# Patient Record
Sex: Male | Born: 1975 | Race: White | Hispanic: No | Marital: Married | Smoking: Never smoker
Health system: Southern US, Community
[De-identification: ages and names within clinical notes are randomized; demographics above are authoritative.]

## PROBLEM LIST (undated history)

## (undated) DIAGNOSIS — E785 Hyperlipidemia, unspecified: Secondary | ICD-10-CM

## (undated) DIAGNOSIS — K7581 Nonalcoholic steatohepatitis (NASH): Secondary | ICD-10-CM

## (undated) DIAGNOSIS — R030 Elevated blood-pressure reading, without diagnosis of hypertension: Secondary | ICD-10-CM

## (undated) DIAGNOSIS — Z8719 Personal history of other diseases of the digestive system: Secondary | ICD-10-CM

## (undated) DIAGNOSIS — F419 Anxiety disorder, unspecified: Secondary | ICD-10-CM

## (undated) DIAGNOSIS — F329 Major depressive disorder, single episode, unspecified: Secondary | ICD-10-CM

## (undated) DIAGNOSIS — F32A Depression, unspecified: Secondary | ICD-10-CM

## (undated) DIAGNOSIS — I83893 Varicose veins of bilateral lower extremities with other complications: Secondary | ICD-10-CM

## (undated) DIAGNOSIS — K219 Gastro-esophageal reflux disease without esophagitis: Secondary | ICD-10-CM

## (undated) HISTORY — DX: Gastro-esophageal reflux disease without esophagitis: K21.9

## (undated) HISTORY — PX: NO PAST SURGERIES: SHX2092

---

## 2002-07-27 ENCOUNTER — Encounter: Admission: RE | Admit: 2002-07-27 | Discharge: 2002-07-27 | Payer: Self-pay | Admitting: Family Medicine

## 2002-07-27 ENCOUNTER — Encounter: Payer: Self-pay | Admitting: Family Medicine

## 2002-08-23 ENCOUNTER — Encounter: Payer: Self-pay | Admitting: Neurology

## 2002-08-23 ENCOUNTER — Ambulatory Visit (HOSPITAL_COMMUNITY): Admission: RE | Admit: 2002-08-23 | Discharge: 2002-08-23 | Payer: Self-pay | Admitting: Neurology

## 2004-04-15 ENCOUNTER — Encounter: Admission: RE | Admit: 2004-04-15 | Discharge: 2004-04-15 | Payer: Self-pay | Admitting: Family Medicine

## 2005-12-08 ENCOUNTER — Ambulatory Visit: Payer: Self-pay | Admitting: Family Medicine

## 2005-12-22 ENCOUNTER — Ambulatory Visit: Payer: Self-pay | Admitting: Internal Medicine

## 2006-04-06 ENCOUNTER — Ambulatory Visit: Payer: Self-pay | Admitting: Family Medicine

## 2006-04-06 LAB — CONVERTED CEMR LAB
Basophils Relative: 0.6 % (ref 0.0–1.0)
Eosinophil percent: 0.9 % (ref 0.0–5.0)
HCT: 43.8 % (ref 39.0–52.0)
Hemoglobin: 15.2 g/dL (ref 13.0–17.0)
INR: 1 (ref 0.9–2.0)
MCHC: 34.8 g/dL (ref 30.0–36.0)
Monocytes Absolute: 0.9 10*3/uL — ABNORMAL HIGH (ref 0.2–0.7)
Neutrophils Relative %: 59.8 % (ref 43.0–77.0)
RDW: 11.9 % (ref 11.5–14.6)
WBC: 8.9 10*3/uL (ref 4.5–10.5)
aPTT: 31 s

## 2006-09-07 ENCOUNTER — Encounter: Admission: RE | Admit: 2006-09-07 | Discharge: 2006-09-07 | Payer: Self-pay | Admitting: Neurology

## 2006-10-02 ENCOUNTER — Encounter (INDEPENDENT_AMBULATORY_CARE_PROVIDER_SITE_OTHER): Payer: Self-pay | Admitting: Family Medicine

## 2006-11-15 ENCOUNTER — Telehealth (INDEPENDENT_AMBULATORY_CARE_PROVIDER_SITE_OTHER): Payer: Self-pay | Admitting: *Deleted

## 2007-01-06 ENCOUNTER — Ambulatory Visit: Payer: Self-pay | Admitting: Family Medicine

## 2007-01-06 DIAGNOSIS — Z77011 Contact with and (suspected) exposure to lead: Secondary | ICD-10-CM | POA: Insufficient documentation

## 2007-01-06 DIAGNOSIS — K219 Gastro-esophageal reflux disease without esophagitis: Secondary | ICD-10-CM | POA: Insufficient documentation

## 2007-01-12 ENCOUNTER — Telehealth (INDEPENDENT_AMBULATORY_CARE_PROVIDER_SITE_OTHER): Payer: Self-pay | Admitting: *Deleted

## 2007-01-16 ENCOUNTER — Encounter (INDEPENDENT_AMBULATORY_CARE_PROVIDER_SITE_OTHER): Payer: Self-pay | Admitting: *Deleted

## 2007-01-27 ENCOUNTER — Ambulatory Visit: Payer: Self-pay | Admitting: Family Medicine

## 2007-01-29 LAB — CONVERTED CEMR LAB
Glucose, Bld: 78 mg/dL (ref 70–99)
HDL: 44.1 mg/dL (ref >39.0)
LDL Cholesterol: 81 mg/dL (ref 0–99)
Total CHOL/HDL Ratio: 3.1
Triglycerides: 64 mg/dL (ref 0–149)
VLDL: 13 mg/dL (ref 0–40)

## 2007-01-30 ENCOUNTER — Telehealth (INDEPENDENT_AMBULATORY_CARE_PROVIDER_SITE_OTHER): Payer: Self-pay | Admitting: *Deleted

## 2007-01-30 ENCOUNTER — Encounter (INDEPENDENT_AMBULATORY_CARE_PROVIDER_SITE_OTHER): Payer: Self-pay | Admitting: *Deleted

## 2007-02-13 ENCOUNTER — Ambulatory Visit: Payer: Self-pay | Admitting: Gastroenterology

## 2007-02-15 ENCOUNTER — Encounter: Payer: Self-pay | Admitting: Gastroenterology

## 2007-02-15 ENCOUNTER — Ambulatory Visit: Payer: Self-pay | Admitting: Gastroenterology

## 2007-02-15 ENCOUNTER — Encounter (INDEPENDENT_AMBULATORY_CARE_PROVIDER_SITE_OTHER): Payer: Self-pay | Admitting: Family Medicine

## 2007-02-15 DIAGNOSIS — K449 Diaphragmatic hernia without obstruction or gangrene: Secondary | ICD-10-CM | POA: Insufficient documentation

## 2007-03-13 ENCOUNTER — Telehealth (INDEPENDENT_AMBULATORY_CARE_PROVIDER_SITE_OTHER): Payer: Self-pay | Admitting: *Deleted

## 2007-04-03 ENCOUNTER — Ambulatory Visit: Payer: Self-pay | Admitting: Internal Medicine

## 2007-05-25 DIAGNOSIS — I10 Essential (primary) hypertension: Secondary | ICD-10-CM | POA: Insufficient documentation

## 2007-05-25 DIAGNOSIS — E669 Obesity, unspecified: Secondary | ICD-10-CM | POA: Insufficient documentation

## 2008-01-22 ENCOUNTER — Telehealth (INDEPENDENT_AMBULATORY_CARE_PROVIDER_SITE_OTHER): Payer: Self-pay | Admitting: *Deleted

## 2008-01-23 ENCOUNTER — Encounter: Payer: Self-pay | Admitting: Internal Medicine

## 2008-01-23 ENCOUNTER — Telehealth (INDEPENDENT_AMBULATORY_CARE_PROVIDER_SITE_OTHER): Payer: Self-pay | Admitting: *Deleted

## 2010-08-18 NOTE — Assessment & Plan Note (Signed)
[facility]                         GASTROENTEROLOGY OFFICE NOTE   NAME:Phillip [name], Phillip [name]                 MRN:          [id number]  DATE:02/13/2007                            DOB:          Apr 16, 1975    Phillip [name] is a 35 year old white male referred through the courtesy of  Dr. Blossom Hoops for evaluation of chronic acid reflux disease.   HISTORY OF PRESENT ILLNESS:  Phillip [name] has had rather typical chronic  acid reflux for at least 15 years.  Phillip [name] describes this as a burning  substernal chest pain with regurgitation.  This is all completely  relieved by taking Nexium 40 mg a day.  Phillip [name] is currently having insurance  problems in terms of receiving his medications.  Phillip [name] denies associated  dysphagia, odynophagia, any hepatobiliary complaints, anorexia, weight  loss, change in bowel habits, melena or hematochezia.  Phillip [name] has never had  endoscopic exams or barium studies.  His weight has been stable, and Phillip [name]  does not smoke or use ethanol.  Phillip [name] denies Raynaud's phenomena, but does  have Ehlers-Danlos syndrome of a very mild degree.   PAST MEDICAL HISTORY:  The patient suffers from mild essential  hypertension but denies other medical difficulties.   ALLERGIES:  PENICILLIN.   FAMILY HISTORY:  Remarkable for Ehlers-Danlos syndrome in his mother.  His father has had colon polyps, and his paternal grandfather had colon  cancer in his 22s.   SOCIAL HISTORY:  Phillip [name] is married and lives with his wife and son.  Phillip [name] has  a bachelor's degree and works as a Furniture conservator/restorer.  Phillip [name] does not  smoke or use ethanol socially.  Phillip [name] denies problems with alcohol  dependency or abuse.   REVIEW OF SYSTEMS:  Otherwise entirely noncontributory.   PHYSICAL EXAMINATION:  VITAL SIGNS:  Phillip [name] is 6 feet, 1 inch and weighs  282.6 pounds.  Blood pressure 130/90, pulse 70 and regular.  GENERAL:  Phillip [name] is a healthy-appearing white male in no distress who  appears his stated age.  Phillip [name] is 6 feet,  1 inch and 283 pounds.  I could  not appreciate stigmata of chronic liver disease or thyromegaly.  CHEST:  Clear to percussion and auscultation.  HEART:  Phillip [name] was in a regular rhythm without significant murmurs, gallops,  or rubs.  ABDOMEN:  I could not appreciate hepatosplenomegaly, abdominal masses or  tenderness.  Bowel sounds were normal.  EXTREMITIES:  Unremarkable.  MENTAL STATUS:  Clear.  RECTAL:  Deferred.   ASSESSMENT:  1. Chronic acid reflux which is doing well on daily proton pump      inhibitor therapy.  2. Rule out Barrett's mucosa of the esophagus.  3. Mild obesity.  Rule out fatty infiltration of the liver.  4. Asymptomatic Ehlers-Danlos syndrome.  5. History of essential hypertension.  6. History of penicillin allergy.   RECOMMENDATIONS:  1. Outpatient endoscopic exam.  Exclude Barrett's mucosa.  2. Reflux regimen reviewed with patient, and will continue Nexium 40      mg 30 minutes before the first meal of the day.  3. Screening laboratory parameters.  4. Would advised colonoscopy at age 38.  Vania Rea. Jarold Motto, MD, Caleen Essex, FAGA  Electronically Signed    DRP/MedQ  DD: 02/13/2007  DT: 02/14/2007  Job #: [id number]   cc:   Leanne Chang, M.D.

## 2012-11-21 ENCOUNTER — Other Ambulatory Visit (INDEPENDENT_AMBULATORY_CARE_PROVIDER_SITE_OTHER): Payer: Self-pay

## 2012-11-21 ENCOUNTER — Ambulatory Visit (INDEPENDENT_AMBULATORY_CARE_PROVIDER_SITE_OTHER): Payer: Private Health Insurance - Indemnity | Admitting: Surgery

## 2012-11-21 ENCOUNTER — Encounter (INDEPENDENT_AMBULATORY_CARE_PROVIDER_SITE_OTHER): Payer: Self-pay | Admitting: Surgery

## 2012-11-21 VITALS — BP 120/86 | HR 92 | Resp 16 | Ht 72.0 in | Wt 299.4 lb

## 2012-11-21 DIAGNOSIS — G4733 Obstructive sleep apnea (adult) (pediatric): Secondary | ICD-10-CM

## 2012-11-21 DIAGNOSIS — Z6841 Body Mass Index (BMI) 40.0 and over, adult: Secondary | ICD-10-CM

## 2012-11-21 DIAGNOSIS — K2100 Gastro-esophageal reflux disease with esophagitis, without bleeding: Secondary | ICD-10-CM

## 2012-11-21 DIAGNOSIS — E669 Obesity, unspecified: Secondary | ICD-10-CM

## 2012-11-21 NOTE — Progress Notes (Signed)
Chief Complaint:  Lifelong morbid obesity with BMI 41  History of Present Illness:  Phillip [name]. is an 37 y.o. male Who comes in today with his wife and has been the one of our seminars and is done a lot of research and once a lap band placed. He has been successful with weight loss agents such as ephedrine when it was legal removing about 100 pounds. However he did not like the way this made him feel. He is done a lot of research and wants to move forward with half and and feels that his best for him. He does have reflux and we'll need to get an upper GI series to look for evidence of a hiatal hernia which we want to fix at the time of his lap band placement. He does have a lot of chronic loud snoring so we will see about getting a study for obstructive sleep apnea.  Past Medical History  Diagnosis Date  . Hyperlipidemia   . Hypertension   . GERD (gastroesophageal reflux disease)     History reviewed. No pertinent past surgical history.  Current Outpatient Prescriptions  Medication Sig Dispense Refill  . buPROPion (WELLBUTRIN XL) 300 MG 24 hr tablet Take 300 mg by mouth daily.      Marland Kitchen omeprazole (PRILOSEC) 20 MG capsule Take 20 mg by mouth daily.       No current facility-administered medications for this visit.   Penicillins Family History  Problem Relation Age of Onset  . Cancer Maternal Grandmother     colon   Social History:   reports that he has never smoked. He has never used smokeless tobacco. He reports that  drinks alcohol. He reports that he does not use illicit drugs.   REVIEW OF SYSTEMS - PERTINENT POSITIVES ONLY: And overall he says he is normal, breast normal, infectious disease is normal, dental normal, cardiac normal, pulmonary normal, and draped in normal, skin normal, GI hiatal hernia with reflux, GU normal, neurologic normal, hematologic lymphatic normal, immune normal, HEENT was glasses, musculoskeletal normal.  Physical Exam:   Blood pressure 120/86,  pulse 92, resp. rate 16, height 6' (1.829 m), weight 299 lb 6.4 oz (135.807 kg). Body mass index is 40.6 kg/(m^2).  Gen:  WDWN WM NAD  Neurological: Alert and oriented to person, place, and time. Motor and sensory function is grossly intact  Head: Normocephalic and atraumatic.  Eyes: Conjunctivae are normal. Pupils are equal, round, and reactive to light. No scleral icterus.  Neck: Normal range of motion. Neck supple. No tracheal deviation or thyromegaly present.  Cardiovascular:  SR without murmurs or gallops.  No carotid bruits Respiratory: Effort normal.  No respiratory distress. No chest wall tenderness. Breath sounds normal.  No wheezes, rales or rhonchi.  Abdomen:  Nontender, obese GU: Musculoskeletal: Normal range of motion. Extremities are nontender. No cyanosis, edema or clubbing noted Lymphadenopathy: No cervical, preauricular, postauricular or axillary adenopathy is present Skin: Skin is warm and dry. No rash noted. No diaphoresis. No erythema. No pallor. Pscyh: Normal mood and affect. Behavior is normal. Judgment and thought content normal.   LABORATORY RESULTS: No results found for this or any previous visit (from the past 48 hour(s)).  RADIOLOGY RESULTS: No results found.  Problem List: Patient Active Problem List   Diagnosis Date Noted  . OBESITY 05/25/2007  . ESSENTIAL HYPERTENSION 05/25/2007  . HIATAL HERNIA 02/15/2007  . GERD 01/06/2007  . HX, PERSONAL, EXPOSURE TO LEAD 01/06/2007    Assessment & Plan:  Morbid obesity BMI 41 with GERD, OSA    [name], MD, [facility] [phone] beeper [phone]  11/21/2012 5:03 PM

## 2012-11-21 NOTE — Addendum Note (Signed)
Addended by: Horatio Pel on: 11/21/2012 05:11 PM   Modules accepted: Orders

## 2012-11-21 NOTE — Patient Instructions (Signed)
Laparoscopic Gastric Band Surgery Care After These instructions give you information on caring for yourself after your procedure. Your doctor may also give you more specific instructions. Call your doctor if you have any problems or questions after your procedure. HOME CARE   Take walks throughout the day. Do not sit for longer than 1 hour while awake for 4 to 6 weeks.  You may shower 2 days after surgery. Pat the surgery cuts (incisions) dry. Do not rub the surgery cuts.  Do your coughing and deep breathing exercises.  Do not lift, push, or pull anything heavy until your doctor says it is okay.  Only take medicines as told by your doctor. Do not drive while taking pain medicine.  Drink plenty of fluids to keep your pee (urine) clear or pale yellow.  Stay on a clear liquid diet as long as your doctor tells you to.  Do not drink caffeine for 1 month.  Change bandages (dressings) as told by your doctor.  Check your surgery cuts for redness, pufffiness (swelling), abnormal coloring, fluid, or bleeding.  Follow your doctor's advice about vitamin and protein needs after surgery. GET HELP RIGHT AWAY IF:  You feel sick to your stomach (nauseous) and throw up (vomit).  You have pain and discomfort with swallowing.  You develop shortness of breath or difficulty breathing.  You have pain, puffiness, or feel warmth on your lower body.  You have very bad calf pain or pain not relieved by medicine.  You have a temperature by mouth above 102 F (38.9 C).  Your surgery cuts look red, puffy, or they leak fluid.  Your poop (stool) is black, tarry, or dark red.  You have chills.  You have chest pain.  You feel confused.  You have slurred speech.  You feel lightheaded when standing.  You suddenly feel weak.  You have any questions or concerns. MAKE SURE YOU:   Understand these instructions.  Will watch your condition.  Will get help right away if you are not doing well or  get worse. Document Released: 04/24/2010 Document Revised: 06/14/2011 Document Reviewed: 04/24/2010 [facility] Patient Information 2014 [place], [place].

## 2012-11-22 LAB — CBC WITH DIFFERENTIAL/PLATELET
Basophils Absolute: 0 10*3/uL (ref 0.0–0.1)
Eosinophils Absolute: 0.1 10*3/uL (ref 0.0–0.7)
Eosinophils Relative: 1 % (ref 0–5)
HCT: 43.4 % (ref 39.0–52.0)
Lymphocytes Relative: 37 % (ref 12–46)
MCH: 31.3 pg (ref 26.0–34.0)
MCHC: 34.8 g/dL (ref 30.0–36.0)
MCV: 90 fL (ref 78.0–100.0)
Monocytes Absolute: 0.6 10*3/uL (ref 0.1–1.0)
Platelets: 205 10*3/uL (ref 150–400)
RDW: 13.1 % (ref 11.5–15.5)

## 2012-11-22 LAB — TSH: TSH: 2.522 u[IU]/mL (ref 0.350–4.500)

## 2012-12-11 ENCOUNTER — Encounter: Payer: Managed Care, Other (non HMO) | Attending: Surgery | Admitting: Dietician

## 2012-12-11 ENCOUNTER — Encounter: Payer: Self-pay | Admitting: Dietician

## 2012-12-11 VITALS — Ht 72.0 in | Wt 312.1 lb

## 2012-12-11 DIAGNOSIS — E669 Obesity, unspecified: Secondary | ICD-10-CM | POA: Insufficient documentation

## 2012-12-11 DIAGNOSIS — Z713 Dietary counseling and surveillance: Secondary | ICD-10-CM | POA: Insufficient documentation

## 2012-12-11 NOTE — Patient Instructions (Addendum)
   Follow Pre-Op Nutrition Goals to prepare for LAGB Surgery.   Call the [facility] at [phone] once you have been given your surgery date to enrolled in the Pre-Op Nutrition Class. You will need to attend this nutrition class 3-4 weeks prior to your surgery. 

## 2012-12-11 NOTE — Progress Notes (Signed)
  Pre-Op Assessment Visit:  Pre-Operative LAGB Surgery  Medical Nutrition Therapy:  Appt start time: 1445   End time:  1530.  Patient was seen on 12/11/2012 for Pre-Operative LAGB Nutrition Assessment. Assessment and letter of approval faxed to [facility] Bariatric Surgery Program coordinator on 12/11/2012.   Handouts given during visit include:  Pre-Op Goals Bariatric Surgery Protein Shakes  Patient to call the [facility] to enroll in Pre-Op and Post-Op Nutrition Education when surgery date is scheduled.

## 2012-12-14 ENCOUNTER — Ambulatory Visit (HOSPITAL_COMMUNITY)
Admission: RE | Admit: 2012-12-14 | Discharge: 2012-12-14 | Disposition: A | Discharge status: Home / Self Care | Payer: Managed Care, Other (non HMO) | Source: Ambulatory Visit | Attending: Surgery | Admitting: Surgery

## 2012-12-14 ENCOUNTER — Ambulatory Visit (HOSPITAL_COMMUNITY): Admission: RE | Admit: 2012-12-14 | Payer: Managed Care, Other (non HMO) | Source: Ambulatory Visit

## 2012-12-14 ENCOUNTER — Other Ambulatory Visit: Payer: Self-pay

## 2012-12-14 DIAGNOSIS — E785 Hyperlipidemia, unspecified: Secondary | ICD-10-CM | POA: Insufficient documentation

## 2012-12-14 DIAGNOSIS — I10 Essential (primary) hypertension: Secondary | ICD-10-CM | POA: Insufficient documentation

## 2012-12-14 DIAGNOSIS — K7689 Other specified diseases of liver: Secondary | ICD-10-CM | POA: Insufficient documentation

## 2012-12-14 DIAGNOSIS — K219 Gastro-esophageal reflux disease without esophagitis: Secondary | ICD-10-CM | POA: Insufficient documentation

## 2012-12-14 DIAGNOSIS — G4733 Obstructive sleep apnea (adult) (pediatric): Secondary | ICD-10-CM | POA: Insufficient documentation

## 2012-12-14 DIAGNOSIS — Z6841 Body Mass Index (BMI) 40.0 and over, adult: Secondary | ICD-10-CM | POA: Insufficient documentation

## 2012-12-14 DIAGNOSIS — K449 Diaphragmatic hernia without obstruction or gangrene: Secondary | ICD-10-CM | POA: Insufficient documentation

## 2012-12-19 ENCOUNTER — Encounter (HOSPITAL_COMMUNITY)
Admission: RE | Disposition: A | Discharge status: Home / Self Care | Payer: Self-pay | Source: Ambulatory Visit | Attending: Surgery

## 2012-12-19 ENCOUNTER — Ambulatory Visit (HOSPITAL_COMMUNITY)
Admission: RE | Admit: 2012-12-19 | Discharge: 2012-12-19 | Disposition: A | Discharge status: Home / Self Care | Payer: Managed Care, Other (non HMO) | Source: Ambulatory Visit | Attending: Surgery | Admitting: Surgery

## 2012-12-19 HISTORY — PX: BREATH TEK H PYLORI: SHX5422

## 2012-12-19 SURGERY — BREATH TEST, FOR HELICOBACTER PYLORI

## 2012-12-20 ENCOUNTER — Encounter (HOSPITAL_COMMUNITY): Payer: Self-pay | Admitting: Surgery

## 2012-12-22 ENCOUNTER — Ambulatory Visit (HOSPITAL_BASED_OUTPATIENT_CLINIC_OR_DEPARTMENT_OTHER): Payer: Managed Care, Other (non HMO) | Attending: Surgery

## 2012-12-22 VITALS — Ht 72.0 in | Wt 296.0 lb

## 2012-12-22 DIAGNOSIS — G4733 Obstructive sleep apnea (adult) (pediatric): Secondary | ICD-10-CM

## 2012-12-22 DIAGNOSIS — R0609 Other forms of dyspnea: Secondary | ICD-10-CM | POA: Insufficient documentation

## 2012-12-22 DIAGNOSIS — R0989 Other specified symptoms and signs involving the circulatory and respiratory systems: Secondary | ICD-10-CM | POA: Insufficient documentation

## 2012-12-22 DIAGNOSIS — I10 Essential (primary) hypertension: Secondary | ICD-10-CM | POA: Insufficient documentation

## 2012-12-23 DIAGNOSIS — G4733 Obstructive sleep apnea (adult) (pediatric): Secondary | ICD-10-CM

## 2013-01-09 ENCOUNTER — Encounter: Payer: Managed Care, Other (non HMO) | Attending: Surgery | Admitting: Dietician

## 2013-01-09 VITALS — Ht 72.0 in | Wt 309.4 lb

## 2013-01-09 DIAGNOSIS — E669 Obesity, unspecified: Secondary | ICD-10-CM

## 2013-01-09 DIAGNOSIS — Z713 Dietary counseling and surveillance: Secondary | ICD-10-CM | POA: Insufficient documentation

## 2013-01-09 NOTE — Progress Notes (Signed)
  3 Months Supervised Weight Loss Visit:   Pre-Operative LAGB Surgery  Medical Nutrition Therapy:  Appt start time: 1645 end time:  1700.  Primary concerns today: Supervised Weight Loss Visit. Eileen is also seeing his physician for supervised weight loss. Tried cutting back on wheat per the advice of his doctor and is working on tapering down on caffeine. He is having less sodas than before.   Not eating much since his wife had breast cancer surgery two week ago.   Weight: 309.4 lbs  BMI: 42.0  24-hr recall: B (AM): Jimmy Dean chicken sausage in tortilla or skips lately Snk (AM):none  L (PM):leftovers Snk (PM): none D (PM):hamburger and mashed pototaos or tacos Snk (PM): tries not to, sometimes will have something sweets  Drinking mostly water and occassionally having soda  Medications: see list  Recent physical activity:  Running 3 x week for a couple miles  Progress Towards Goal(s):  In progress.  Nutritional Diagnosis:  [place]-3.3 Overweight/obesity related to past poor dietary habits and physical inactivity as evidenced by patient approved for bariatric surgery following dietary guidelines for continued weight loss.    Intervention:  Nutrition counseling provided.   Plan: Continue working on [employer]. Consider having Crystal Light or Mio to add to water to help get at least 64 oz per day. Work on chewing thoroughly to help with not drinking at meal times. Keep exercising with a goal of 30 min 5 x day.  Monitoring/Evaluation:  Dietary intake, exercise, and body weight. Follow up in 1 months for 3 month supervised weight loss visit.

## 2013-01-09 NOTE — Patient Instructions (Addendum)
Continue working on [employer]. Consider having Crystal Light or Mio to add to water to help get at least 64 oz per day. Work on chewing thoroughly to help with not drinking at meal times. Keep exercising with a goal of 30 min 5 x day.

## 2013-01-23 NOTE — Procedures (Signed)
NAMEEWEL, LONA               ACCOUNT NO.:  [id number]  MEDICAL RECORD NO.:  [id number]         PATIENT TYPE:  OUT  LOCATION:  SLEEP CENTER                 FACILITY:  [facility]  PHYSICIAN:  Clinton D. Maple Hudson, MD, FCCP, FACPDATE OF BIRTH:  09-29-1977  DATE OF STUDY:  12/22/2012                           NOCTURNAL POLYSOMNOGRAM  REFERRING PHYSICIAN:  Thornton Park. Daphine Deutscher, MD  INDICATION FOR STUDY:  Hypersomnia with sleep apnea.  EPWORTH SLEEPINESS SCORE:  14/24.  BMI 40.1, weight 296 pounds, height 72 inches, neck 16 inches.  MEDICATIONS:  Home medications were charted for review.  SLEEP ARCHITECTURE:  Total sleep time 334.5 minutes with sleep efficiency 79.2%.  Stage I was 10.5%.  Stage II 64.1%.  Stage III absent.  REM 25.4% of total sleep time.  Sleep latency 14.5 minutes, REM latency 168.5 minutes, awake after sleep onset 72.5 minutes.  Arousal index 11.3.  Bedtime medication:  None.  RESPIRATORY DATA:  Apnea-hypopnea index (AHI) 4.8 per hour.  A total of 27 events was scored, all is hypopneas.  Events were more common while supine.  REM AHI of 7.1 per hour.  He did not qualify for split protocol CPAP.  OXYGEN DATA:  Moderate snoring with oxygen desaturation to a nadir of 88% and mean oxygen saturation through the study of 95% on room air.  CARDIAC DATA:  Sinus rhythm.  MOVEMENT-PARASOMNIA:  A few limb jerks were noted with little effect on sleep.  No bathroom trips.  IMPRESSIONS-RECOMMENDATIONS:  Occasional respiratory events with sleep disturbance, within normal limits.  AHI 4.8 per hour (the normal range for adults is an AHI from 0-5 events per hour).  Moderate snoring with oxygen desaturation to a nadir of 88% and mean oxygen saturation through the study of 95% on room air.     Clinton D. Maple Hudson, MD, [facility], FACP Diplomate, American Board of Sleep Medicine    CDY/MEDQ  D:  12/23/2012 11:49:56  T:  12/23/2012 12:53:26  Job:  [id number]

## 2013-02-08 ENCOUNTER — Encounter: Payer: Managed Care, Other (non HMO) | Attending: Surgery | Admitting: Dietician

## 2013-02-08 VITALS — Ht 72.0 in | Wt 310.3 lb

## 2013-02-08 DIAGNOSIS — E669 Obesity, unspecified: Secondary | ICD-10-CM

## 2013-02-08 DIAGNOSIS — Z713 Dietary counseling and surveillance: Secondary | ICD-10-CM | POA: Insufficient documentation

## 2013-02-08 NOTE — Progress Notes (Signed)
  3 Months Supervised Weight Loss Visit:   Pre-Operative LAGB Surgery  Medical Nutrition Therapy:  Appt start time: 1530 end time:  1545.  Primary concerns today: Supervised Weight Loss Visit. Phillip [name] is also seeing his physician for supervised weight loss. Working with his doctor on portion control. Still having some caffeine and the same amount of sodas as last time.   310.3 Weight: 309.4 lbs  BMI: 42.0  24-hr recall: B (AM): Jimmy Dean chicken sausage in tortilla or skips lately Snk (AM):none  L (PM):leftovers Snk (PM): none D (PM):hamburger and mashed pototaos or tacos Snk (PM): tries not to, sometimes will have something sweets  Drinking mostly water and occassionally having soda  Medications: see list  Recent physical activity:  Running 3 x week for a couple miles  Progress Towards Goal(s):  In progress.  Nutritional Diagnosis:  [place]-3.3 Overweight/obesity related to past poor dietary habits and physical inactivity as evidenced by patient approved for bariatric surgery following dietary guidelines for continued weight loss.    Intervention:  Nutrition counseling provided.   Plan: Continue working on [employer]. Continue drinking 64 oz water per day, continue working on sipping and limiting sodas.  Continue chewing thoroughly to help with not drinking at meal times. Keep exercising with a goal of 30 min 5 x day.  Work on eating 3 meals per day and 2-3 snacks (try to eat something like a protein shake in the morning).    Monitoring/Evaluation:  Dietary intake, exercise, and body weight. Follow up in 1 months for 3 month supervised weight loss visit.

## 2013-02-08 NOTE — Patient Instructions (Addendum)
Continue working on [employer]. Continue drinking 64 oz water per day, continue working on sipping and limiting sodas.  Continue chewing thoroughly to help with not drinking at meal times. Keep exercising with a goal of 30 min 5 x day.  Work on eating 3 meals per day and 2-3 snacks (try to eat something like a protein shake in the morning).

## 2013-03-08 ENCOUNTER — Encounter: Payer: Managed Care, Other (non HMO) | Attending: Surgery | Admitting: Dietician

## 2013-03-08 VITALS — Ht 72.0 in | Wt 305.0 lb

## 2013-03-08 DIAGNOSIS — E669 Obesity, unspecified: Secondary | ICD-10-CM

## 2013-03-08 DIAGNOSIS — Z713 Dietary counseling and surveillance: Secondary | ICD-10-CM | POA: Insufficient documentation

## 2013-03-08 NOTE — Patient Instructions (Addendum)
Continue working on [employer]. Continue drinking 64 oz water per day, continue working on sipping and limiting sodas.  Continue chewing thoroughly to help with not drinking at meal times. Exercise with a goal of 30 min 5 x day.  Work on eating 3 meals per day and 2-3 snacks (try to eat something like a protein shake in the morning).   Contact [facility] when surgery is scheduled to enroll in Pre-Op class.

## 2013-03-08 NOTE — Progress Notes (Signed)
  3 Months Supervised Weight Loss Visit:   Pre-Operative LAGB Surgery  Medical Nutrition Therapy:  Appt start time: 1715 end time:  1730.  Primary concerns today: Supervised Weight Loss Visit. Lost about 5 pounds since last visit. Continued to work on portion control. Still tapering down on soda and caffeine. Hasn't had fast food in past 4 weeks.   Weight: 305 lbs BMI: 41.3  24-hr recall: B (AM): [employer] chicken sausage in tortilla or skips lately Snk (AM):none  L (PM):leftovers Snk (PM): none D (PM):hamburger and mashed pototaos or tacos Snk (PM): tries not to, sometimes will have something sweets  Drinking mostly water and occassionally having soda  Medications: see list  Recent physical activity:  Stopped running d/t working 13 hours per day, plans to start in [place]   Progress Towards Goal(s):  In progress.  Nutritional Diagnosis:  [place]-3.3 Overweight/obesity related to past poor dietary habits and physical inactivity as evidenced by patient approved for bariatric surgery following dietary guidelines for continued weight loss.    Intervention:  Nutrition counseling provided.   Plan: Continue working on [employer]. Continue drinking 64 oz water per day, continue working on sipping and limiting sodas.  Continue chewing thoroughly to help with not drinking at meal times. Exercise with a goal of 30 min 5 x day.  Work on eating 3 meals per day and 2-3 snacks (try to eat something like a protein shake in the morning).   Contact [facility] when surgery is scheduled to enroll in Pre-Op class.   Monitoring/Evaluation:  Dietary intake, exercise, and body weight. Follow up for Pre-Op class.

## 2013-04-26 ENCOUNTER — Telehealth (INDEPENDENT_AMBULATORY_CARE_PROVIDER_SITE_OTHER): Payer: Self-pay

## 2013-04-26 NOTE — Telephone Encounter (Signed)
Message copied by Maryan PulsMOORE, Lovena Kluck on Thu Apr 26, 2013  5:15 PM ------      Message from: Louie CasaBARAJAS, RUTH      Created: Mon Apr 16, 2013 10:40 AM      Regarding: Dr. Judeth CornfieldMartin/Question?      Contact: [phone]       Patient is going to have a bariatric surgery but he has hernia problem and wants to know if Dr. Daphine DeutscherMartin can do both surgeries at the same time, please call him.            Thank you. ------

## 2013-04-26 NOTE — Telephone Encounter (Signed)
Called and spoke to patient regarding having his hernia repaired at the time of his bariatric surgery.  I reviewed with Dr. Daphine DeutscherMartin and he did say that this is possible.  Patient made aware to discuss this during his pre-op appointment once scheduled for surgery.  Patient verbalized understanding

## 2013-06-12 NOTE — Progress Notes (Signed)
Surgery on 06/26/13.  Preop on 06/19/13 at 0800am.  Need orders in EPIC.  Thank You.

## 2013-06-14 NOTE — Progress Notes (Signed)
Surgery on 06/26/13.  Preop on 3/17 at 0800am.  Need orders in EPIC. Thank You.

## 2013-06-15 ENCOUNTER — Encounter (HOSPITAL_COMMUNITY): Payer: Self-pay | Admitting: Pharmacy Technician

## 2013-06-18 NOTE — Patient Instructions (Signed)
20 Charan E Johnnye SimaBunnell Jr.  06/18/2013   Your procedure is scheduled on: 06/26/13  [facility].  Call this number if you have problems the morning of surgery 336-: [phone]   Remember:   Do not eat food or drink liquids After Midnight.     Take these medicines the morning of surgery with A SIP OF WATER: wellbutrin, omeprazole    Do not wear jewelry, make-up or nail polish.  Do not wear lotions, powders, or perfumes. You may wear deodorant.  Do not shave 48 hours prior to surgery. Men may shave face and neck.  Do not bring valuables to the hospital.  Contacts, dentures or bridgework may not be worn into surgery.  Leave suitcase in the car. After surgery it may be brought to your room.  For patients admitted to the hospital, checkout time is 11:00 AM the day of discharge.    Please read over the following fact sheets that you were given:[place] preparing for surgery sheet Birdie Sonsachel Amontae Ng, RN  pre op nurse call if needed [phone]    FAILURE TO FOLLOW THESE INSTRUCTIONS MAY RESULT IN CANCELLATION OF YOUR SURGERY   Patient Signature: ___________________________________________

## 2013-06-18 NOTE — Progress Notes (Signed)
Chest x-ray 12/14/12 on EPIC, EKG 12/14/12 on EPIC

## 2013-06-19 ENCOUNTER — Encounter (HOSPITAL_COMMUNITY)
Admission: RE | Admit: 2013-06-19 | Discharge: 2013-06-19 | Disposition: A | Discharge status: Home / Self Care | Payer: Managed Care, Other (non HMO) | Source: Ambulatory Visit | Attending: Surgery | Admitting: Surgery

## 2013-06-19 ENCOUNTER — Encounter (HOSPITAL_COMMUNITY): Payer: Self-pay

## 2013-06-19 DIAGNOSIS — Z01812 Encounter for preprocedural laboratory examination: Secondary | ICD-10-CM | POA: Insufficient documentation

## 2013-06-19 HISTORY — DX: Major depressive disorder, single episode, unspecified: F32.9

## 2013-06-19 HISTORY — DX: Depression, unspecified: F32.A

## 2013-06-19 HISTORY — DX: Anxiety disorder, unspecified: F41.9

## 2013-06-19 HISTORY — DX: Elevated blood-pressure reading, without diagnosis of hypertension: R03.0

## 2013-06-19 HISTORY — DX: Hyperlipidemia, unspecified: E78.5

## 2013-06-19 LAB — CBC
HCT: 44.7 % (ref 39.0–52.0)
Hemoglobin: 15.4 g/dL (ref 13.0–17.0)
MCH: 31.3 pg (ref 26.0–34.0)
MCHC: 34.5 g/dL (ref 30.0–36.0)
MCV: 90.9 fL (ref 78.0–100.0)
PLATELETS: 203 10*3/uL (ref 150–400)
RBC: 4.92 MIL/uL (ref 4.22–5.81)
RDW: 12.4 % (ref 11.5–15.5)
WBC: 7.8 10*3/uL (ref 4.0–10.5)

## 2013-06-19 LAB — BASIC METABOLIC PANEL
BUN: 14 mg/dL (ref 6–23)
CALCIUM: 10.1 mg/dL (ref 8.4–10.5)
CO2: 26 meq/L (ref 19–32)
Chloride: 100 mEq/L (ref 96–112)
Creatinine, Ser: 1.17 mg/dL (ref 0.50–1.35)
GFR calc non Af Amer: 79 mL/min — ABNORMAL LOW (ref >90)
Glucose, Bld: 93 mg/dL (ref 70–99)
Potassium: 4 mEq/L (ref 3.7–5.3)
SODIUM: 140 meq/L (ref 137–147)

## 2013-06-21 ENCOUNTER — Encounter: Payer: Managed Care, Other (non HMO) | Attending: Surgery

## 2013-06-21 VITALS — Ht 72.0 in | Wt 293.0 lb

## 2013-06-21 DIAGNOSIS — E669 Obesity, unspecified: Secondary | ICD-10-CM

## 2013-06-21 DIAGNOSIS — Z713 Dietary counseling and surveillance: Secondary | ICD-10-CM | POA: Insufficient documentation

## 2013-06-21 NOTE — Progress Notes (Signed)
  Pre-Operative Nutrition Class:  Appt start time: 830   End time:  1030.  Patient was seen on 06/21/2013 for Pre-Operative Bariatric Surgery Education at [facility].   Surgery date: 06/26/2013 Surgery type: LAGB Start weight at [facility]: 312 on 12/11/2012 Weight today: 293 lbs  TANITA  BODY COMP RESULTS  06/21/2013   BMI (kg/m^2) 39.7   Fat Mass (lbs) 106.5   Fat Free Mass (lbs) 186.5   Total Body Water (lbs) 136.5   Samples given per MNT protocol. Patient educated on appropriate usage: Celebrate Multivitamin (grape)  Lot #: [id number]  Exp: 07/2014   Bariactiv Calcium Citrate (berry)  Lot #: [id number]  Exp: 08/2014   Premier protein shake (vanilla)  Lot #: [id number]  Exp: 01/2014   Renee Pain Protein Powder (chocolate)  Lot #: [id number]  Exp: 07/2014   The following the learning objectives were met by the patient during this course:  Identify Pre-Op Dietary Goals and will begin 2 weeks pre-operatively  Identify appropriate sources of fluids and proteins   State protein recommendations and appropriate sources pre and post-operatively  Identify Post-Operative Dietary Goals and will follow for 2 weeks post-operatively  Identify appropriate multivitamin and calcium sources  Describe the need for physical activity post-operatively and will follow MD recommendations  State when to call healthcare provider regarding medication questions or post-operative complications  Handouts given during class include:  Pre-Op Bariatric Surgery Diet Handout  Protein Shake Handout  Post-Op Bariatric Surgery Nutrition Handout  BELT Program Information Flyer  Support Group Information Flyer  [facility] Bariatric Supplements Price List  Follow-Up Plan: Patient will follow-up at [facility] 2 weeks post operatively for diet advancement per MD.

## 2013-06-22 ENCOUNTER — Other Ambulatory Visit (INDEPENDENT_AMBULATORY_CARE_PROVIDER_SITE_OTHER): Payer: Self-pay | Admitting: Surgery

## 2013-06-22 ENCOUNTER — Encounter (INDEPENDENT_AMBULATORY_CARE_PROVIDER_SITE_OTHER): Payer: Self-pay | Admitting: Surgery

## 2013-06-22 ENCOUNTER — Ambulatory Visit (INDEPENDENT_AMBULATORY_CARE_PROVIDER_SITE_OTHER): Payer: Private Health Insurance - Indemnity | Admitting: Surgery

## 2013-06-22 VITALS — BP 120/80 | HR 80 | Temp 97.6°F | Resp 16 | Ht 72.0 in | Wt 297.0 lb

## 2013-06-22 DIAGNOSIS — E669 Obesity, unspecified: Secondary | ICD-10-CM

## 2013-06-22 DIAGNOSIS — K219 Gastro-esophageal reflux disease without esophagitis: Secondary | ICD-10-CM

## 2013-06-22 MED ORDER — HYDROCODONE-ACETAMINOPHEN 7.5-325 MG/15ML PO SOLN: 10.0000 mL | Freq: Four times a day (QID) | ORAL | Status: DC | PRN

## 2013-06-22 NOTE — Progress Notes (Signed)
Chief Complaint:  Lifelong morbid obesity with BMI 41  History of Present Illness:  Phillip [name]. is an 38 y.o. male who comes in today with his wife and has been the one of our seminars and is done a lot of research and wants a lap band placed. He has been successful with weight loss agents such as ephedrine when it was legal removing about 100 pounds. However he did not like the way this made him feel. He has done a lot of research and wants to move forward with lapband and feels that his best for him. He does have reflux and we'll need to get an upper GI series to look for evidence of a hiatal hernia which we want to fix at the time of his lap band placement. He does have a lot of chronic loud snoring so we will see about getting a study for obstructive sleep apnea.  UGI showed small hiatus hernia with reflux.    Past Medical History  Diagnosis Date  . GERD (gastroesophageal reflux disease)   . Anxiety   . Depression   . Borderline high blood pressure   . Borderline hyperlipidemia     Past Surgical History  Procedure Laterality Date  . Breath tek h pylori N/A 12/19/2012    Procedure: BREATH TEK H PYLORI;  Surgeon: Valarie MerinoMatthew B Nikya Busler, MD;  [facility];  Service: General;  Laterality: N/A;  Pt is scheduled at 730  . No past surgeries      Current Outpatient Prescriptions  Medication Sig Dispense Refill  . buPROPion (WELLBUTRIN XL) 300 MG 24 hr tablet Take 300 mg by mouth every morning.       Marland Kitchen. omeprazole (PRILOSEC) 20 MG capsule Take 20 mg by mouth daily.      Marland Kitchen. HYDROcodone-acetaminophen (HYCET) 7.5-325 mg/15 ml solution Take 10 mLs by mouth 4 (four) times daily as needed for moderate pain.  120 mL  0   No current facility-administered medications for this visit.   Penicillins Family History  Problem Relation Age of Onset  . Cancer Maternal Grandmother     colon  . COPD Other   . Hypertension Other   . Hyperlipidemia Other   . Obesity Other    Social History:    reports that he has never smoked. He quit smokeless tobacco use about 3 years ago. His smokeless tobacco use included Chew. He reports that he drinks alcohol. He reports that he does not use illicit drugs.   REVIEW OF SYSTEMS - PERTINENT POSITIVES ONLY: And overall he says he is normal, breast normal, infectious disease is normal, dental normal, cardiac normal, pulmonary normal, and draped in normal, skin normal, GI hiatal hernia with reflux, GU normal, neurologic normal, hematologic lymphatic normal, immune normal, HEENT was glasses, musculoskeletal normal.  Physical Exam:   Blood pressure 120/80, pulse 80, temperature 97.6 F (36.4 C), temperature source Temporal, resp. rate 16, height 5' (1.524 m), weight 297 lb (134.718 kg). Body mass index is 58 kg/(m^2).  Gen:  WDWN WM NAD  Neurological: Alert and oriented to person, place, and time. Motor and sensory function is grossly intact  Head: Normocephalic and atraumatic.  Eyes: Conjunctivae are normal. Pupils are equal, round, and reactive to light. No scleral icterus.  Neck: Normal range of motion. Neck supple. No tracheal deviation or thyromegaly present.  Cardiovascular:  SR without murmurs or gallops.  No carotid bruits Respiratory: Effort normal.  No respiratory distress. No chest wall tenderness. Breath sounds normal.  No wheezes, rales or rhonchi.  Abdomen:  Nontender, obese GU: Musculoskeletal: Normal range of motion. Extremities are nontender. No cyanosis, edema or clubbing noted Lymphadenopathy: No cervical, preauricular, postauricular or axillary adenopathy is present Skin: Skin is warm and dry. No rash noted. No diaphoresis. No erythema. No pallor. Pscyh: Normal mood and affect. Behavior is normal. Judgment and thought content normal.   LABORATORY RESULTS: No results found for this or any previous visit (from the past 48 hour(s)).  RADIOLOGY RESULTS: No results found.  Problem List: Patient Active Problem List   Diagnosis  Date Noted  . OBESITY 05/25/2007  . ESSENTIAL HYPERTENSION 05/25/2007  . HIATAL HERNIA 02/15/2007  . GERD 01/06/2007  . HX, PERSONAL, EXPOSURE TO LEAD 01/06/2007    Assessment & Plan: Morbid obesity BMI 41 with GERD, OSA    [name], MD, [facility] [phone] beeper [phone]  06/22/2013 12:00 PM

## 2013-06-22 NOTE — Patient Instructions (Signed)

## 2013-06-26 ENCOUNTER — Encounter (HOSPITAL_COMMUNITY): Payer: Self-pay | Admitting: *Deleted

## 2013-06-26 ENCOUNTER — Observation Stay (HOSPITAL_COMMUNITY)
Admission: RE | Admit: 2013-06-26 | Discharge: 2013-06-27 | Disposition: A | Discharge status: Home / Self Care | Payer: Managed Care, Other (non HMO) | Source: Ambulatory Visit | Attending: Surgery | Admitting: Surgery

## 2013-06-26 ENCOUNTER — Encounter (HOSPITAL_COMMUNITY): Payer: Managed Care, Other (non HMO) | Admitting: Anesthesiology

## 2013-06-26 ENCOUNTER — Ambulatory Visit (HOSPITAL_COMMUNITY): Payer: Managed Care, Other (non HMO) | Admitting: Anesthesiology

## 2013-06-26 ENCOUNTER — Encounter (HOSPITAL_COMMUNITY)
Admission: RE | Disposition: A | Discharge status: Home / Self Care | Payer: Self-pay | Source: Ambulatory Visit | Attending: Surgery

## 2013-06-26 DIAGNOSIS — K2100 Gastro-esophageal reflux disease with esophagitis, without bleeding: Secondary | ICD-10-CM

## 2013-06-26 DIAGNOSIS — K420 Umbilical hernia with obstruction, without gangrene: Secondary | ICD-10-CM

## 2013-06-26 DIAGNOSIS — Z6841 Body Mass Index (BMI) 40.0 and over, adult: Secondary | ICD-10-CM

## 2013-06-26 DIAGNOSIS — Z9884 Bariatric surgery status: Secondary | ICD-10-CM

## 2013-06-26 DIAGNOSIS — K449 Diaphragmatic hernia without obstruction or gangrene: Secondary | ICD-10-CM | POA: Insufficient documentation

## 2013-06-26 DIAGNOSIS — K219 Gastro-esophageal reflux disease without esophagitis: Secondary | ICD-10-CM | POA: Insufficient documentation

## 2013-06-26 DIAGNOSIS — Z87891 Personal history of nicotine dependence: Secondary | ICD-10-CM | POA: Insufficient documentation

## 2013-06-26 DIAGNOSIS — G4733 Obstructive sleep apnea (adult) (pediatric): Secondary | ICD-10-CM | POA: Insufficient documentation

## 2013-06-26 DIAGNOSIS — F411 Generalized anxiety disorder: Secondary | ICD-10-CM | POA: Insufficient documentation

## 2013-06-26 DIAGNOSIS — F329 Major depressive disorder, single episode, unspecified: Secondary | ICD-10-CM | POA: Insufficient documentation

## 2013-06-26 DIAGNOSIS — F3289 Other specified depressive episodes: Secondary | ICD-10-CM | POA: Insufficient documentation

## 2013-06-26 HISTORY — PX: LAPAROSCOPIC GASTRIC BANDING WITH HIATAL HERNIA REPAIR: SHX6351

## 2013-06-26 HISTORY — PX: UMBILICAL HERNIA REPAIR: SHX196

## 2013-06-26 LAB — CBC
HCT: 40.2 % (ref 39.0–52.0)
Hemoglobin: 13.7 g/dL (ref 13.0–17.0)
MCH: 31.4 pg (ref 26.0–34.0)
MCHC: 34.1 g/dL (ref 30.0–36.0)
MCV: 92.2 fL (ref 78.0–100.0)
Platelets: 173 10*3/uL (ref 150–400)
RBC: 4.36 MIL/uL (ref 4.22–5.81)
RDW: 12.4 % (ref 11.5–15.5)
WBC: 8.8 10*3/uL (ref 4.0–10.5)

## 2013-06-26 LAB — CREATININE, SERUM
CREATININE: 1.08 mg/dL (ref 0.50–1.35)
GFR calc Af Amer: >90 mL/min (ref >90)
GFR, EST NON AFRICAN AMERICAN: 87 mL/min — AB (ref >90)

## 2013-06-26 SURGERY — GASTRIC BANDING, LAPAROSCOPIC, WITH HIATAL HERNIA REPAIR
Anesthesia: General | Site: Abdomen

## 2013-06-26 MED ORDER — HYDROCODONE-ACETAMINOPHEN 7.5-325 MG/15ML PO SOLN
10.0000 mL | Freq: Four times a day (QID) | ORAL | Status: DC | PRN
  Administered 2013-06-27: 10 mL via ORAL
  Filled 2013-06-26: qty 15

## 2013-06-26 MED ORDER — ONDANSETRON HCL 4 MG/2ML IJ SOLN
INTRAMUSCULAR | Status: DC | PRN
  Administered 2013-06-26: 4 mg via INTRAVENOUS

## 2013-06-26 MED ORDER — SODIUM CHLORIDE 0.9 % IJ SOLN
INTRAMUSCULAR | Status: AC
  Filled 2013-06-26: qty 10

## 2013-06-26 MED ORDER — OXYCODONE HCL 5 MG/5ML PO SOLN: 5.0000 mg | ORAL | Status: DC | PRN

## 2013-06-26 MED ORDER — HYDROMORPHONE HCL PF 1 MG/ML IJ SOLN
INTRAMUSCULAR | Status: AC
  Filled 2013-06-26: qty 1

## 2013-06-26 MED ORDER — HEPARIN SODIUM (PORCINE) 5000 UNIT/ML IJ SOLN
5000.0000 [IU] | Freq: Three times a day (TID) | INTRAMUSCULAR | Status: DC
  Administered 2013-06-27: 5000 [IU] via SUBCUTANEOUS
  Filled 2013-06-26 (×4): qty 1

## 2013-06-26 MED ORDER — BUPIVACAINE-EPINEPHRINE PF 0.25-1:200000 % IJ SOLN
INTRAMUSCULAR | Status: AC
  Filled 2013-06-26: qty 30

## 2013-06-26 MED ORDER — UNJURY CHICKEN SOUP POWDER: 2.0000 [oz_av] | Freq: Four times a day (QID) | ORAL | Status: DC

## 2013-06-26 MED ORDER — CHLORHEXIDINE GLUCONATE 4 % EX LIQD: 60.0000 mL | Freq: Once | CUTANEOUS | Status: DC

## 2013-06-26 MED ORDER — LIDOCAINE HCL (CARDIAC) 20 MG/ML IV SOLN
INTRAVENOUS | Status: DC | PRN
  Administered 2013-06-26: 80 mg via INTRAVENOUS

## 2013-06-26 MED ORDER — HEPARIN SODIUM (PORCINE) 5000 UNIT/ML IJ SOLN
5000.0000 [IU] | INTRAMUSCULAR | Status: AC
  Administered 2013-06-26: 5000 [IU] via SUBCUTANEOUS
  Filled 2013-06-26: qty 1

## 2013-06-26 MED ORDER — ACETAMINOPHEN 160 MG/5ML PO SOLN: 650.0000 mg | ORAL | Status: DC | PRN

## 2013-06-26 MED ORDER — SODIUM CHLORIDE 0.9 % IJ SOLN
INTRAMUSCULAR | Status: AC
  Filled 2013-06-26: qty 50

## 2013-06-26 MED ORDER — DEXTROSE 5 % IV SOLN
INTRAVENOUS | Status: AC
Start: 2013-06-26 — End: 2013-06-26
  Filled 2013-06-26 (×2): qty 1

## 2013-06-26 MED ORDER — KCL IN DEXTROSE-NACL 20-5-0.45 MEQ/L-%-% IV SOLN
INTRAVENOUS | Status: DC
  Administered 2013-06-26: 13:00:00 via INTRAVENOUS
  Filled 2013-06-26 (×4): qty 1000

## 2013-06-26 MED ORDER — ROCURONIUM BROMIDE 100 MG/10ML IV SOLN
INTRAVENOUS | Status: AC
  Filled 2013-06-26: qty 1

## 2013-06-26 MED ORDER — SODIUM CHLORIDE 0.9 % IJ SOLN
INTRAMUSCULAR | Status: DC | PRN
  Administered 2013-06-26: 10 mL

## 2013-06-26 MED ORDER — HYDROMORPHONE HCL PF 2 MG/ML IJ SOLN
INTRAMUSCULAR | Status: AC
  Filled 2013-06-26: qty 1

## 2013-06-26 MED ORDER — EPHEDRINE SULFATE 50 MG/ML IJ SOLN
INTRAMUSCULAR | Status: AC
  Filled 2013-06-26: qty 1

## 2013-06-26 MED ORDER — GLYCOPYRROLATE 0.2 MG/ML IJ SOLN
INTRAMUSCULAR | Status: DC | PRN
  Administered 2013-06-26: 0.6 mg via INTRAVENOUS

## 2013-06-26 MED ORDER — PROMETHAZINE HCL 25 MG/ML IJ SOLN
6.2500 mg | INTRAMUSCULAR | Status: DC | PRN
  Administered 2013-06-26: 12.5 mg via INTRAVENOUS

## 2013-06-26 MED ORDER — UNJURY VANILLA POWDER
2.0000 [oz_av] | Freq: Four times a day (QID) | ORAL | Status: DC
Start: 2013-06-27 — End: 2013-06-27
  Administered 2013-06-27: 2 [oz_av] via ORAL

## 2013-06-26 MED ORDER — GLYCOPYRROLATE 0.2 MG/ML IJ SOLN
INTRAMUSCULAR | Status: AC
  Filled 2013-06-26: qty 3

## 2013-06-26 MED ORDER — PROMETHAZINE HCL 25 MG/ML IJ SOLN
INTRAMUSCULAR | Status: AC
  Filled 2013-06-26: qty 1

## 2013-06-26 MED ORDER — LACTATED RINGERS IR SOLN
Status: DC | PRN
  Administered 2013-06-26: 1000 mL

## 2013-06-26 MED ORDER — DEXTROSE 5 % IV SOLN
2.0000 g | INTRAVENOUS | Status: AC
  Administered 2013-06-26: 2 g via INTRAVENOUS
  Filled 2013-06-26: qty 2

## 2013-06-26 MED ORDER — MIDAZOLAM HCL 2 MG/2ML IJ SOLN
INTRAMUSCULAR | Status: AC
  Filled 2013-06-26: qty 2

## 2013-06-26 MED ORDER — MORPHINE SULFATE 2 MG/ML IJ SOLN
2.0000 mg | INTRAMUSCULAR | Status: DC | PRN
  Administered 2013-06-26 – 2013-06-27 (×6): 4 mg via INTRAVENOUS
  Filled 2013-06-26: qty 1
  Filled 2013-06-26 (×4): qty 2
  Filled 2013-06-26: qty 1
  Filled 2013-06-26: qty 2

## 2013-06-26 MED ORDER — PROPOFOL 10 MG/ML IV BOLUS
INTRAVENOUS | Status: AC
  Filled 2013-06-26: qty 20

## 2013-06-26 MED ORDER — SUCCINYLCHOLINE CHLORIDE 20 MG/ML IJ SOLN
INTRAMUSCULAR | Status: DC | PRN
  Administered 2013-06-26: 140 mg via INTRAVENOUS

## 2013-06-26 MED ORDER — BUPIVACAINE LIPOSOME 1.3 % IJ SUSP
20.0000 mL | Freq: Once | INTRAMUSCULAR | Status: AC
  Administered 2013-06-26: 20 mL
  Filled 2013-06-26: qty 20

## 2013-06-26 MED ORDER — UNJURY CHOCOLATE CLASSIC POWDER: 2.0000 [oz_av] | Freq: Four times a day (QID) | ORAL | Status: DC

## 2013-06-26 MED ORDER — ONDANSETRON HCL 4 MG/2ML IJ SOLN
INTRAMUSCULAR | Status: AC
  Filled 2013-06-26: qty 2

## 2013-06-26 MED ORDER — HYDROMORPHONE HCL PF 1 MG/ML IJ SOLN
INTRAMUSCULAR | Status: DC | PRN
  Administered 2013-06-26: 1 mg via INTRAVENOUS

## 2013-06-26 MED ORDER — ROCURONIUM BROMIDE 100 MG/10ML IV SOLN
INTRAVENOUS | Status: DC | PRN
  Administered 2013-06-26 (×2): 10 mg via INTRAVENOUS
  Administered 2013-06-26: 20 mg via INTRAVENOUS
  Administered 2013-06-26: 40 mg via INTRAVENOUS

## 2013-06-26 MED ORDER — FENTANYL CITRATE 0.05 MG/ML IJ SOLN
INTRAMUSCULAR | Status: DC | PRN
  Administered 2013-06-26: 100 ug via INTRAVENOUS
  Administered 2013-06-26: 50 ug via INTRAVENOUS
  Administered 2013-06-26: 100 ug via INTRAVENOUS

## 2013-06-26 MED ORDER — HYDROMORPHONE HCL PF 1 MG/ML IJ SOLN
0.2500 mg | INTRAMUSCULAR | Status: DC | PRN
  Administered 2013-06-26 (×4): 0.5 mg via INTRAVENOUS

## 2013-06-26 MED ORDER — KCL IN DEXTROSE-NACL 20-5-0.45 MEQ/L-%-% IV SOLN
INTRAVENOUS | Status: AC
  Filled 2013-06-26: qty 1000

## 2013-06-26 MED ORDER — NEOSTIGMINE METHYLSULFATE 1 MG/ML IJ SOLN
INTRAMUSCULAR | Status: AC
  Filled 2013-06-26: qty 10

## 2013-06-26 MED ORDER — LIDOCAINE HCL (CARDIAC) 20 MG/ML IV SOLN
INTRAVENOUS | Status: AC
  Filled 2013-06-26: qty 5

## 2013-06-26 MED ORDER — PROPOFOL 10 MG/ML IV BOLUS
INTRAVENOUS | Status: DC | PRN
  Administered 2013-06-26: 250 mg via INTRAVENOUS

## 2013-06-26 MED ORDER — LACTATED RINGERS IV SOLN
INTRAVENOUS | Status: DC | PRN
  Administered 2013-06-26: 07:00:00 via INTRAVENOUS

## 2013-06-26 MED ORDER — ACETAMINOPHEN 160 MG/5ML PO SOLN: 325.0000 mg | ORAL | Status: DC | PRN

## 2013-06-26 MED ORDER — MIDAZOLAM HCL 5 MG/5ML IJ SOLN
INTRAMUSCULAR | Status: DC | PRN
Start: 2013-06-26 — End: 2013-06-26
  Administered 2013-06-26: 2 mg via INTRAVENOUS

## 2013-06-26 MED ORDER — FENTANYL CITRATE 0.05 MG/ML IJ SOLN
INTRAMUSCULAR | Status: AC
  Filled 2013-06-26: qty 5

## 2013-06-26 MED ORDER — ONDANSETRON HCL 4 MG/2ML IJ SOLN: 4.0000 mg | INTRAMUSCULAR | Status: DC | PRN

## 2013-06-26 MED ORDER — NEOSTIGMINE METHYLSULFATE 1 MG/ML IJ SOLN
INTRAMUSCULAR | Status: DC | PRN
  Administered 2013-06-26: 4 mg via INTRAVENOUS

## 2013-06-26 SURGICAL SUPPLY — 57 items
BAND LAP 10.0 W/TUBES (Band) ×3 IMPLANT
BENZOIN TINCTURE PRP APPL 2/3 (GAUZE/BANDAGES/DRESSINGS) IMPLANT
BLADE HEX COATED 2.75 (ELECTRODE) ×3 IMPLANT
BLADE SURG 15 STRL LF DISP TIS (BLADE) ×2 IMPLANT
BLADE SURG 15 STRL SS (BLADE) ×1
CANISTER SUCTION 2500CC (MISCELLANEOUS) IMPLANT
DECANTER SPIKE VIAL GLASS SM (MISCELLANEOUS) ×3 IMPLANT
DEVICE SUT QUICK LOAD TK 5 (STAPLE) ×9 IMPLANT
DEVICE SUT TI-KNOT TK 5X26 (MISCELLANEOUS) ×3 IMPLANT
DEVICE SUTURE ENDOST 10MM (ENDOMECHANICALS) IMPLANT
DISSECTOR BLUNT TIP ENDO 5MM (MISCELLANEOUS) ×3 IMPLANT
DRAPE CAMERA CLOSED 9X96 (DRAPES) ×3 IMPLANT
DRAPE LG THREE QUARTER DISP (DRAPES) ×9 IMPLANT
ELECT REM PT RETURN 9FT ADLT (ELECTROSURGICAL) ×3
ELECTRODE REM PT RTRN 9FT ADLT (ELECTROSURGICAL) ×2 IMPLANT
GLOVE BIOGEL M 8.0 STRL (GLOVE) ×3 IMPLANT
GOWN STRL REUS W/TWL XL LVL3 (GOWN DISPOSABLE) ×15 IMPLANT
HOVERMATT SINGLE USE (MISCELLANEOUS) ×3 IMPLANT
KIT BASIN OR (CUSTOM PROCEDURE TRAY) ×3 IMPLANT
MANIFOLD NEPTUNE II (INSTRUMENTS) ×3 IMPLANT
MESH HERNIA 1X4 RECT BARD (Mesh General) ×2 IMPLANT
MESH HERNIA BARD 1X4 (Mesh General) ×1 IMPLANT
NEEDLE SPNL 22GX3.5 QUINCKE BK (NEEDLE) ×3 IMPLANT
NS IRRIG 1000ML POUR BTL (IV SOLUTION) ×3 IMPLANT
PACK UNIVERSAL I (CUSTOM PROCEDURE TRAY) ×3 IMPLANT
PENCIL BUTTON HOLSTER BLD 10FT (ELECTRODE) ×3 IMPLANT
SCALPEL HARMONIC ACE (MISCELLANEOUS) IMPLANT
SET IRRIG TUBING LAPAROSCOPIC (IRRIGATION / IRRIGATOR) ×3 IMPLANT
SHEARS CURVED HARMONIC AC 45CM (MISCELLANEOUS) IMPLANT
SLEEVE ADV FIXATION 5X100MM (TROCAR) IMPLANT
SLEEVE XCEL OPT CAN 5 100 (ENDOMECHANICALS) ×6 IMPLANT
SOLUTION ANTI FOG 6CC (MISCELLANEOUS) ×3 IMPLANT
SPONGE GAUZE 4X4 12PLY (GAUZE/BANDAGES/DRESSINGS) ×3 IMPLANT
SPONGE LAP 18X18 X RAY DECT (DISPOSABLE) ×3 IMPLANT
STAPLER VISISTAT 35W (STAPLE) ×3 IMPLANT
STRIP CLOSURE SKIN 1/2X4 (GAUZE/BANDAGES/DRESSINGS) IMPLANT
SUT ETHIBOND 2 0 SH (SUTURE) ×3
SUT ETHIBOND 2 0 SH 36X2 (SUTURE) ×6 IMPLANT
SUT NOVA NAB GS-21 0 18 T12 DT (SUTURE) ×3 IMPLANT
SUT PROLENE 2 0 CT2 30 (SUTURE) ×3 IMPLANT
SUT SILK 0 (SUTURE) ×1
SUT SILK 0 30XBRD TIE 6 (SUTURE) ×2 IMPLANT
SUT SURGIDAC NAB ES-9 0 48 120 (SUTURE) ×3 IMPLANT
SUT VIC AB 2-0 SH 27 (SUTURE)
SUT VIC AB 2-0 SH 27X BRD (SUTURE) IMPLANT
SUT VIC AB 4-0 SH 18 (SUTURE) ×3 IMPLANT
SYR 20CC LL (SYRINGE) ×6 IMPLANT
TOWEL OR 17X26 10 PK STRL BLUE (TOWEL DISPOSABLE) ×6 IMPLANT
TOWEL OR NON WOVEN STRL DISP B (DISPOSABLE) ×3 IMPLANT
TROCAR ADV FIXATION 11X100MM (TROCAR) IMPLANT
TROCAR BLADELESS 15MM (ENDOMECHANICALS) ×3 IMPLANT
TROCAR BLADELESS OPT 5 100 (ENDOMECHANICALS) ×3 IMPLANT
TROCAR XCEL 12X100 BLDLESS (ENDOMECHANICALS) ×3 IMPLANT
TROCAR XCEL NON-BLD 11X100MML (ENDOMECHANICALS) IMPLANT
TROCAR XCEL UNIV SLVE 11M 100M (ENDOMECHANICALS) IMPLANT
TUBE CALIBRATION LAPBAND (TUBING) ×3 IMPLANT
TUBING INSUFFLATION 10FT LAP (TUBING) ×3 IMPLANT

## 2013-06-26 NOTE — Transfer of Care (Signed)
Immediate Anesthesia Transfer of Care Note  Patient: Phillip Citizenlbin E Segall Jr.  Procedure(s) Performed: Procedure(s): LAPAROSCOPIC GASTRIC BANDING WITH HIATAL HERNIA REPAIR HERNIA REPAIR UMBILICAL ADULT  Patient Location: PACU  Anesthesia Type:General  Level of Consciousness: awake, alert  and oriented  Airway & Oxygen Therapy: Patient Spontanous Breathing and Patient connected to face mask oxygen  Post-op Assessment: Report given to PACU RN, Post -op Vital signs reviewed and stable and Patient moving all extremities X 4  Post vital signs: Reviewed and stable  Complications: No apparent anesthesia complications

## 2013-06-26 NOTE — Progress Notes (Signed)
Patient alert and oriented.  Provided support and encouragement.  Encouraged pulmonary toilet, ambulation and small sips of liquids.  All questions answered.  Will continue to monitor. 

## 2013-06-26 NOTE — Progress Notes (Signed)
Pt arrived from PACU on stretcher, slid self to bed. Dermabond to lap sites on abd x 6 d/i. VSS. IVF infusing. POC discussed w/ pt and wife. Pt oriented to callbell and environment.

## 2013-06-26 NOTE — Op Note (Signed)
06/26/2013  Surgeon: Wenda LowMatt Alizeh Madril, MD, FACS Asst:  Jaclynn GuarneriBen Hoxworth, MD, FACS  Procedure: Laparoscopic adjustable gastric banding with APS; one suture repair of hiatus hernia; excision of incarcerated supraumbilical hernia with primary repair  Anes:  General  EBL:  Minimal  Description of Procedure  The patient was taken to OR # 11 and given general anesthesia.  After a prep with PCMX the patient was draped and a timeout performed.  Access to the abdomen was achieved with a 0 degree Optiview technique through the left upper quadrant.  This was 5mm and most of the trocars with the exception of the right lateral 15 mm and the supraumbilical 12 mm were placed satisfactorily.  The supraumbilical site was used to allow excision of the incarcerated fat in a 1 cm epigastric hernia.    Adhesions were minimal.  Ports were placed to the the right of the midline including a 15 trocar in  the right upper quadrant placed obliquely.  The Satira Mccallumathanson was used to retract the left lateral segment and the peritoneum was incised along the left crus.   The EJ junction as assessed for a hiatus hernia and a dimple was seen .  A balloon test was equivocal with 10 cc air.  Posterior dissection was done on the right side and a large fat mass was removed from the hiatus.  The dilated hiatus was closed with a single posterior suture of surgidek placed with the Endo Stitch.  .    The pars flaccida technique was utilized to insert the blunt "finger " dissector from right to left behind the stomach.  This created a target zone to pass the band passer.     The lapband APS  Had been previously flushed and was inserted through the 15 trocar.  It was placed in the tip of "the finger"  and pulled around behind the stomach.   The band was plicated with 3 free surgidek sutures secured with ty knots.  The tubing was brought out through the lower incision on the right and connected to the port which had mesh sewn onto the back and was  placed into the subcutaneous pocket.  The supra umbilical hernia was dissected free and the herniated fat removed.  The defect which would not allow my finger to traverse was closed with 2 sutures of 0 Novafil.  A third suture was used to close the  Trocar site.  These closures were inspected endoscopically.    The incisions were injected with Exparel and closed with 4-0 vicryl and Dermabond.     The patient was taken to the PACU in stable condition.    Matt B. Daphine DeutscherMartin, MD, [facility], [place] [phone]

## 2013-06-26 NOTE — H&P (View-Only) (Signed)
Chief Complaint:  Lifelong morbid obesity with BMI 41  History of Present Illness:  Phillip E Johnnye SimaBunnell Jr. is an 38 y.o. male who comes in today with his wife and has been the one of our seminars and is done a lot of research and wants a lap band placed. He has been successful with weight loss agents such as ephedrine when it was legal removing about 100 pounds. However he did not like the way this made him feel. He has done a lot of research and wants to move forward with lapband and feels that his best for him. He does have reflux and we'll need to get an upper GI series to look for evidence of a hiatal hernia which we want to fix at the time of his lap band placement. He does have a lot of chronic loud snoring so we will see about getting a study for obstructive sleep apnea.  UGI showed small hiatus hernia with reflux.    Past Medical History  Diagnosis Date  . GERD (gastroesophageal reflux disease)   . Anxiety   . Depression   . Borderline high blood pressure   . Borderline hyperlipidemia     Past Surgical History  Procedure Laterality Date  . Breath tek h pylori N/A 12/19/2012    Procedure: BREATH TEK H PYLORI;  Surgeon: Valarie MerinoMatthew B Amillya Chavira, MD;  [facility];  Service: General;  Laterality: N/A;  Pt is scheduled at 730  . No past surgeries      Current Outpatient Prescriptions  Medication Sig Dispense Refill  . buPROPion (WELLBUTRIN XL) 300 MG 24 hr tablet Take 300 mg by mouth every morning.       Marland Kitchen. omeprazole (PRILOSEC) 20 MG capsule Take 20 mg by mouth daily.      Marland Kitchen. HYDROcodone-acetaminophen (HYCET) 7.5-325 mg/15 ml solution Take 10 mLs by mouth 4 (four) times daily as needed for moderate pain.  120 mL  0   No current facility-administered medications for this visit.   Penicillins Family History  Problem Relation Age of Onset  . Cancer Maternal Grandmother     colon  . COPD Other   . Hypertension Other   . Hyperlipidemia Other   . Obesity Other    Social History:    reports that he has never smoked. He quit smokeless tobacco use about 3 years ago. His smokeless tobacco use included Chew. He reports that he drinks alcohol. He reports that he does not use illicit drugs.   REVIEW OF SYSTEMS - PERTINENT POSITIVES ONLY: And overall he says he is normal, breast normal, infectious disease is normal, dental normal, cardiac normal, pulmonary normal, and draped in normal, skin normal, GI hiatal hernia with reflux, GU normal, neurologic normal, hematologic lymphatic normal, immune normal, HEENT was glasses, musculoskeletal normal.  Physical Exam:   Blood pressure 120/80, pulse 80, temperature 97.6 F (36.4 C), temperature source Temporal, resp. rate 16, height 5' (1.524 m), weight 297 lb (134.718 kg). Body mass index is 58 kg/(m^2).  Gen:  WDWN WM NAD  Neurological: Alert and oriented to person, place, and time. Motor and sensory function is grossly intact  Head: Normocephalic and atraumatic.  Eyes: Conjunctivae are normal. Pupils are equal, round, and reactive to light. No scleral icterus.  Neck: Normal range of motion. Neck supple. No tracheal deviation or thyromegaly present.  Cardiovascular:  SR without murmurs or gallops.  No carotid bruits Respiratory: Effort normal.  No respiratory distress. No chest wall tenderness. Breath sounds normal.  No wheezes, rales or rhonchi.  Abdomen:  Nontender, obese GU: Musculoskeletal: Normal range of motion. Extremities are nontender. No cyanosis, edema or clubbing noted Lymphadenopathy: No cervical, preauricular, postauricular or axillary adenopathy is present Skin: Skin is warm and dry. No rash noted. No diaphoresis. No erythema. No pallor. Pscyh: Normal mood and affect. Behavior is normal. Judgment and thought content normal.   LABORATORY RESULTS: No results found for this or any previous visit (from the past 48 hour(s)).  RADIOLOGY RESULTS: No results found.  Problem List: Patient Active Problem List   Diagnosis  Date Noted  . OBESITY 05/25/2007  . ESSENTIAL HYPERTENSION 05/25/2007  . HIATAL HERNIA 02/15/2007  . GERD 01/06/2007  . HX, PERSONAL, EXPOSURE TO LEAD 01/06/2007    Assessment & Plan: Morbid obesity BMI 41 with GERD, OSA    [name], MD, [facility] [phone] beeper [phone]  06/22/2013 12:00 PM

## 2013-06-26 NOTE — Preoperative (Signed)
Beta Blockers   Reason not to administer Beta Blockers:Not Applicable 

## 2013-06-26 NOTE — Anesthesia Preprocedure Evaluation (Addendum)
Anesthesia Evaluation  Patient identified by MRN, date of birth, ID band Patient awake    Reviewed: Allergy & Precautions, H&P , NPO status , Patient's Chart, lab work & pertinent test results  Airway Mallampati: II TM Distance: >3 FB Neck ROM: Full    Dental no notable dental hx.    Pulmonary neg pulmonary ROS,  breath sounds clear to auscultation  Pulmonary exam normal       Cardiovascular hypertension, Rhythm:Regular Rate:Normal     Neuro/Psych PSYCHIATRIC DISORDERS Anxiety Depression  Neuromuscular disease    GI/Hepatic Neg liver ROS, GERD-  Medicated,  Endo/Other  Morbid obesity  Renal/GU negative Renal ROS  negative genitourinary   Musculoskeletal negative musculoskeletal ROS (+)   Abdominal (+) + obese,   Peds negative pediatric ROS (+)  Hematology negative hematology ROS (+)   Anesthesia Other Findings   Reproductive/Obstetrics negative OB ROS                           Anesthesia Physical Anesthesia Plan  ASA: III  Anesthesia Plan: General   Post-op Pain Management:    Induction: Intravenous  Airway Management Planned: Oral ETT  Additional Equipment:   Intra-op Plan:   Post-operative Plan: Extubation in OR  Informed Consent: I have reviewed the patients History and Physical, chart, labs and discussed the procedure including the risks, benefits and alternatives for the proposed anesthesia with the patient or authorized representative who has indicated his/her understanding and acceptance.   Dental advisory given  Plan Discussed with: CRNA  Anesthesia Plan Comments:        Anesthesia Quick Evaluation

## 2013-06-26 NOTE — Anesthesia Postprocedure Evaluation (Signed)
  Anesthesia Post-op Note  Patient: Phillip CitizenElbin E Kitner Jr.  Procedure(s) Performed: Procedure(s): LAPAROSCOPIC GASTRIC BANDING WITH HIATAL HERNIA REPAIR HERNIA REPAIR UMBILICAL ADULT  Patient Location: PACU  Anesthesia Type: General  Level of Consciousness: awake and alert   Airway and Oxygen Therapy: Patient Spontanous Breathing  Post-op Pain: mild  Post-op Assessment: Post-op Vital signs reviewed, Patient's Cardiovascular Status Stable, Respiratory Function Stable, Patent Airway and No signs of Nausea or vomiting  Last Vitals:  Filed Vitals:   06/26/13 1529  BP: 98/64  Pulse: 75  Temp: 36.8 C  Resp: 18    Post-op Vital Signs: stable   Complications: No apparent anesthesia complications

## 2013-06-26 NOTE — Interval H&P Note (Signed)
History and Physical Interval Note:  06/26/2013 7:42 AM  Phillip E Johnnye SimaBunnell Jr.  has presented today for surgery, with the diagnosis of morbid obesity   The various methods of treatment have been discussed with the patient and family. After consideration of risks, benefits and other options for treatment, the patient has consented to  Procedure(s): LAPAROSCOPIC GASTRIC BANDING (N/A) as a surgical intervention .  The patient's history has been reviewed, patient examined, no change in status, stable for surgery.  I have reviewed the patient's chart and labs.  Questions were answered to the patient's satisfaction.     [name]

## 2013-06-27 ENCOUNTER — Encounter (HOSPITAL_COMMUNITY): Payer: Self-pay | Admitting: Surgery

## 2013-06-27 ENCOUNTER — Observation Stay (HOSPITAL_COMMUNITY): Payer: Managed Care, Other (non HMO)

## 2013-06-27 LAB — CBC WITH DIFFERENTIAL/PLATELET
BASOS ABS: 0 10*3/uL (ref 0.0–0.1)
Basophils Relative: 1 % (ref 0–1)
Eosinophils Absolute: 0.3 10*3/uL (ref 0.0–0.7)
Eosinophils Relative: 3 % (ref 0–5)
HCT: 42.3 % (ref 39.0–52.0)
Hemoglobin: 14.1 g/dL (ref 13.0–17.0)
LYMPHS ABS: 2.7 10*3/uL (ref 0.7–4.0)
LYMPHS PCT: 33 % (ref 12–46)
MCH: 31.3 pg (ref 26.0–34.0)
MCHC: 33.3 g/dL (ref 30.0–36.0)
MCV: 93.8 fL (ref 78.0–100.0)
Monocytes Absolute: 0.9 10*3/uL (ref 0.1–1.0)
Monocytes Relative: 11 % (ref 3–12)
NEUTROS PCT: 53 % (ref 43–77)
Neutro Abs: 4.4 10*3/uL (ref 1.7–7.7)
PLATELETS: 165 10*3/uL (ref 150–400)
RBC: 4.51 MIL/uL (ref 4.22–5.81)
RDW: 12.6 % (ref 11.5–15.5)
WBC: 8.3 10*3/uL (ref 4.0–10.5)

## 2013-06-27 MED ORDER — IOHEXOL 300 MG/ML  SOLN
20.0000 mL | Freq: Once | INTRAMUSCULAR | Status: AC | PRN
  Administered 2013-06-27: 20 mL via ORAL

## 2013-06-27 NOTE — Discharge Summary (Signed)
Physician Discharge Summary  Patient ID: Phillip CitizenElbin E Gent Jr. MRN: [id number] DOB/AGE: 1976/02/09 37 y.o.  Admit date: 06/26/2013 Discharge date: 06/27/2013  Admission Diagnoses:  Morbid obesity and GERD  Discharge Diagnoses:  same  Active Problems:   Lapband APS + HH repair March 2015   Surgery:  Lapband APS and posterior hiatus hernia repair;  Repair of epigastric hernia  Discharged Condition: improved  Hospital Course:   Had surgery.  Did well.  UGI .  Ready for discharge on PD 1  Consults: none  Significant Diagnostic Studies: UGI    Discharge Exam: Blood pressure 117/78, pulse 89, temperature 97.5 F (36.4 C), temperature source Oral, resp. rate 18, height 6' (1.829 m), weight 294 lb 3.2 oz (133.448 kg), SpO2 97.00%. Incisions ok.    Disposition: 01-Home or Self Care  Discharge Orders   Future Appointments Provider Department Dept Phone   07/10/2013 3:30 PM [facility]-Op [facility] [phone]   07/26/2013 11:40 AM Valarie MerinoMatthew B Delita Chiquito, MD [facility] [place] [phone]   Future Orders Complete By Expires   Discharge instructions  As directed    Comments:     Follow bariatric dietary guidelines   Discharge wound care:  As directed    Comments:     May shower and get incisions wet   Increase activity slowly  As directed        Medication List         buPROPion 300 MG 24 hr tablet  Commonly known as:  WELLBUTRIN XL  Take 300 mg by mouth every morning.     HYDROcodone-acetaminophen 7.5-325 mg/15 ml solution  Commonly known as:  HYCET  Take 10 mLs by mouth 4 (four) times daily as needed for moderate pain.     omeprazole 20 MG capsule  Commonly known as:  PRILOSEC  Take 20 mg by mouth daily.           Follow-up Information   Follow up with Valarie MerinoMARTIN,[name], MD.   Specialty:  General Surgery   Contact information:   [address] [place] [address] [phone]        Signed: Valarie MerinoMARTIN,Nicolasa Milbrath B 06/27/2013, 8:06 AM

## 2013-06-27 NOTE — Progress Notes (Signed)
Nutrition Education Note  Body mass index is 39.89 kg/(m^2). Patient meets criteria for class II obesity based on current BMI.   Consulted per DROP protocol. Educated pt on diet progression for the first 2 weeks post-op with emphasis on adequate hydration and intake of appropriate liquids - caffeine free, sugar free, and non-carbonated. Multivitamins and minerals also reviewed.   Diet: First 2 Weeks   You will see the nutritionist about two (2) weeks after your surgery. The nutritionist will increase the types of foods you can eat if you are handling liquids well:  If you have severe vomiting or nausea and cannot handle clear liquids lasting longer than 1 day, call your surgeon  Protein Shake  Drink at least 2 ounces of shake 5-6 times per day  Each serving of protein shakes (usually 8 - 12 ounces) should have a minimum of:  15 grams of protein  And no more than 5 grams of carbohydrate  Goal for protein each day:  Men = 80 grams per day  Women = 60 grams per day  Protein powder may be added to fluids such as non-fat milk or Lactaid milk or Soy milk (limit to 35 grams added protein powder per serving)   Hydration  Slowly increase the amount of water and other clear liquids as tolerated (See Acceptable Fluids)  Slowly increase the amount of protein shake as tolerated  Sip fluids slowly and throughout the day  May use sugar substitutes in small amounts (no more than 6 - 8 packets per day; i.e. Splenda)   Fluid Goal  The first goal is to drink at least 8 ounces of protein shake/drink per day (or as directed by the nutritionist); some examples of protein shakes are [employer] Nectar, Dillard'sdkins Advantage, EAS Edge HP, and Unjury. See handout from pre-op Bariatric Education Class:  Slowly increase the amount of protein shake you drink as tolerated  You may find it easier to slowly sip shakes throughout the day  It is important to get your proteins in first  Your fluid goal is to drink 64 - 100  ounces of fluid daily  It may take a few weeks to build up to this  32 oz (or more) should be clear liquids  And  32 oz (or more) should be full liquids (see below for examples)  Liquids should not contain sugar, caffeine, or carbonation   Clear Liquids:  Water or Sugar-free flavored water (i.e. Fruit H2O, Propel)  Decaffeinated coffee or tea (sugar-free)  Crystal Lite, Wyler's Lite, Minute Maid Lite  Sugar-free Jell-O  Bouillon or broth  Sugar-free Popsicle: *Less than 20 calories each; Limit 1 per day   Full Liquids:  Protein Shakes/Drinks + 2 choices per day of other full liquids  Full liquids must be:  No More Than 12 grams of Carbs per serving  No More Than 3 grams of Fat per serving  Strained low-fat cream soup  Non-Fat milk  Fat-free Lactaid Milk  Sugar-free yogurt (Dannon Lite & Fit, Greek yogurt)         AlpineHeather Baron MS, RD, LDN [phone] Pager [phone] After Hours Pager

## 2013-06-27 NOTE — Progress Notes (Signed)
Patient is alert and oriented.  Pain is controlled, and patient is tolerating fluids.  advanced to protein shake today.  Reviewed Adjustable gastric band discharge instructions with patient, patient able to articulate understanding.  Provided information on BELT program, Support Group and [facility] outpatient pharmacy. All questions answered, will continue to monitor.

## 2013-06-27 NOTE — Discharge Instructions (Signed)
Laparoscopic Gastric Band Surgery Care After These instructions give you information on caring for yourself after your procedure. Your doctor may also give you more specific instructions. Call your doctor if you have any problems or questions after your procedure. HOME CARE   Take walks throughout the day. Do not sit for longer than 1 hour while awake for 4 to 6 weeks.  You may shower 2 days after surgery. Pat the surgery cuts (incisions) dry. Do not rub the surgery cuts.  Do your coughing and deep breathing exercises.  Do not lift, push, or pull anything heavy until your doctor says it is okay.  Only take medicines as told by your doctor. Do not drive while taking pain medicine.  Drink plenty of fluids to keep your pee (urine) clear or pale yellow.  Stay on a liquid diet as long as your doctor tells you to.  Do not drink caffeine for 1 month.  Change bandages (dressings) as told by your doctor.  Check your surgery cuts for redness, pufffiness (swelling), abnormal coloring, fluid, or bleeding.  Follow your doctor's advice about vitamin and protein needs after surgery. GET HELP RIGHT AWAY IF:  You feel sick to your stomach (nauseous) and throw up (vomit).  You have pain and discomfort with swallowing.  You develop shortness of breath or difficulty breathing.  You have pain, puffiness, or feel warmth on your lower body.  You have very bad calf pain or pain not relieved by medicine.  You have a temperature by mouth above 102 F (38.9 C).  Your surgery cuts look red, puffy, or they leak fluid.  Your poop (stool) is black, tarry, or dark red.  You have chills.  You have chest pain.  You feel confused.  You have slurred speech.  You feel lightheaded when standing.  You suddenly feel weak.  You have any questions or concerns. MAKE SURE YOU:   Understand these instructions.  Will watch your condition.  Will get help right away if you are not doing well or get  worse. Document Released: 04/24/2010 Document Revised: 07/17/2012 Document Reviewed: 04/24/2010 [facility] Patient Information 2014 [place], [place].                    ADJUSTABLE GASTRIC BAND  Home Care Instructions   These instructions are to help you care for yourself when you go home.  Call: If you have any problems. Call [phone] and ask for the surgeon on call If you need immediate assistance come to the ER at [facility]. Tell the ER staff you are a new post-op gastric banding patient  Signs and symptoms to report: Severe  vomiting or nausea If you cannot handle clear liquids for longer than 1 day, call your surgeon Abdominal pain which does not get better after taking your pain medication Fever greater than 100.4  F and chills Heart rate over 100 beats a minute Trouble breathing Chest pain Redness,  swelling, drainage, or foul odor at incision (surgical) sites If your incisions open or pull apart Swelling or pain in calf (lower leg) Diarrhea (Loose bowel movements that happen often), frequent watery, uncontrolled bowel movements Constipation, (no bowel movements for 3 days) if this happens: Take Milk of Magnesia, 2 tablespoons by mouth, 3 times a day for 2 days if needed Stop taking Milk of Magnesia once you have had a bowel movement Call your doctor if constipation continues Or Take Miralax  (instead of Milk of Magnesia) following the label instructions Stop  taking Miralax once you have had a bowel movement °Call your doctor if constipation continues °Anything you think is “abnormal for you” °  °Normal side effects after surgery: Unable to sleep at night or unable to concentrate °Irritability °Being tearful (crying) or depressed ° °These are common complaints, possibly related to your anesthesia, stress of surgery, and change in lifestyle, that usually go away a few weeks after surgery. If these feelings continue, call your medical doctor.  °Wound Care: You may have  surgical glue, steri-strips, or staples over your incisions after surgery °Surgical glue: Looks like clear film over your incisions and will wear off a little at a time °Steri-strips: Adhesive strips of tape over your incisions. You may notice a yellowish color on skin under the steri-strips. This is used to make the steri-strips stick better. Do not pull the steri-strips off - let them fall off °Staples: Staples may be removed before you leave the hospital °If you go home with staples, call [facility] for an appointment with your surgeon’s nurse to have staples removed 10 days after surgery, [phone] °Showering: You may shower two (2) days after your surgery unless your surgeon tells you differently °Wash gently around incisions with warm soapy water, rinse well, and gently pat dry °If you have a drain (tube from your incision), you may need someone to hold this while you shower °No tub baths until staples are removed and incisions are healed °  °Medications: Medications should be liquid or crushed if larger than the size of a dime °Extended release pills (medication that releases a little bit at a time through the  day) should not be crushed °Depending on the size and number of medications you take, you may need to space (take a few throughout the day)/change the time you take your medications so that you do not over-fill your pouch (smaller stomach) °Make sure you follow-up with you primary care physician to make medication changes needed during rapid weight loss and life -style changes °If you have diabetes, follow up with your doctor that orders your diabetes medication(s) within one week after surgery and check your blood sugar regularly ° °Do not drive while taking narcotics (pain medications) ° °Do not take acetaminophen (Tylenol) and Roxicet or Lortab Elixir at the same time since these pain medications contain acetaminophen °  °Diet:  °First 2 Weeks You will see the nutritionist about  two (2) weeks after your surgery. The nutritionist will increase the types of foods you can eat if you are handling liquids well: °If you have severe vomiting or nausea and cannot handle clear liquids lasting longer than 1 day call your surgeon °For Same Day Surgery Discharge Patients: °The day of surgery drink water only: 2 ounces every 4 hours °If you are handling water, start drinking your high protein shake the next morning °For Overnight Stay Patients: °Begin by drinking 2 ounces of a high protein every 3 hours, 5-6 times per day °Slowly increase the amount you drink as tolerated °You may find it easier to slowly sip shakes throughout the day. It is important to get your proteins in first °  ° Protein Shake °Drink at least 2 ounces of shake 5-6 times per day °Each serving of protein shakes (usually 8-12 ounces) should have a minimum of: °15 grams of protein °And no more than 5 grams of carbohydrate °Goal for protein each day: °Men = 80 grams per day °Women = 60 grams per day °Protein powder may be   added to fluids such as non-fat milk or Lactaid milk or Soy milk (limit to 35 grams added protein powder per serving)  Hydration Slowly increase the amount of water and other clear liquids as tolerated (See Acceptable Fluids) Slowly increase the amount of protein shake as tolerated Sip fluids slowly and throughout the day May use sugar substitutes in small amounts (no more than 6-8 packets per day; i.e. Splenda)  Fluid Goal The first goal is to drink at least 8 ounces of protein shake/drink per day (or as directed by the nutritionist); some examples of protein shakes are Syntrax, Nectar, [employer], EAS Edge HP, and Unjury. - See handout from pre-op Bariatric Education Class: Slowly increase the amount of protein shake you drink as tolerated You may find it easier to slowly sip shakes throughout the day It is important to get your proteins in first Your fluid goal is to drink 64-100 ounces of fluid  daily It may take a few weeks to build up to this  32 oz. (or more) should be full liquids (see below for examples) Liquids should not contain sugar, caffeine, or carbonation  Clear Liquids: Water of Sugar-free flavored water (i.e. Fruit HO, Propel) Decaffeinated coffee or tea (sugar-free) Crystal lite, Wylers Lite, Minute Maid Lite Sugar-free Jell-O Bouillon or broth Sugar-free Popsicle:    - Less than 20 calories each; Limit 1 per day        Full Liquids:                   Protein Shakes/Drinks + 2 choices per day of other full liquids Full liquids must be: No More Than 12 grams of Carbs per serving No More Than 3 grams of Fat per serving Strained low-fat cream soup Non-Fat milk Fat-free Lactaid Milk Sugar-free yogurt (Dannon Lite & Fit, Greek yogurt)   Vitamins and Minerals Start 1 day after surgery unless otherwise directed by your surgeon 1 Chewable Multivitamin / Multimineral Supplement with iron (i.e. Centrum for Adults) Chewable Calcium Citrate with Vitamin D-3 (Example: 3 Chewable Calcium  Plus 600 with vitamin D-3) Take 500 mg three (3) times a day for a total of 1500 mg per day Do not take all 3 doses of calcium at one time as it may cause constipation, and you can only absorb 500 mg at a time Do not mix multivitamins containing iron with calcium supplements;  take 2 hours apart Do not substitute Tums (calcium carbonate) for your calcium Menstruating women and those at risk for anemia ( a blood disease that causes weakness) may need extra iron Talk to your doctor to see if you need more iron If you need extra iron: total daily iron recommendation (including Vitamins) is 50 to 100 mg Iron/day Do not stop taking or change any vitamins or minerals until you talk to your nutritionist or surgeon Your nutritionist and/or surgeon must approve all vitamin and mineral supplements  Activity and Exercise: It is important to continue walking at home. Limit your physical  activity as instructed by your doctor. During this time, use these guidelines: Do not lift anything greater than ten  (10) pounds for at least two (2) weeks Do not go back to work or drive until Engineer, production says you can You may have sex when you feel comfortable It is VERY important for male patients to use a reliable birth control method; fertility often increase after surgery Do not get pregnant for at least 18 months Start exercising as soon as your  doctor tells you that you can Make sure your doctor approves any physical activity Start with a simple walking program Walk 5-15 minutes each day, 7 days per week Slowly increase until you are walking 30-45 minutes per day Consider joining our [place] program. [phone] or email [email or web address]    Special Instructions  Things to remember: Free counseling is available for you and your family through collaboration between [facility] and [place]. Please call [phone] and leave a message Use your CPAP when sleeping if this applies to you Consider buying a medical alert bracelet that says you had lap-band surgery You will likely have your first fill (fluid added to your band) 6 - 8 weeks after surgery [facility] has a free Bariatric Surgery Support Group that meets monthly, the 3rd Thursday, [place]. You can see classes online at [email or web address] It is very important to keep all follow up appointments with your surgeon, nutritionist, primary care physician, and behavioral health practitioner After the first year, please follow up with your bariatric surgeon and nutritionist at least once a year in order to maintain best weight loss results                    [facility]:  [place]: [phone]               Bariatric Nurse Coordinator: [phone]      Adjustable Gastric Band Home Care  Instructions  Rev. 05/2012                                                                  Reviewed and Endorsed                                                   by [facility] Patient Education Committee, Jan, 2014

## 2013-06-28 ENCOUNTER — Telehealth (HOSPITAL_COMMUNITY): Payer: Self-pay

## 2013-06-28 NOTE — Telephone Encounter (Signed)
Made discharge phone call to patient per DROP protocol. Asking the following questions.    1. Do you have someone to care for you now that you are home?  y 2. Are you having pain now that is not relieved by your pain medication?  Gas pain, but gas x is helping 3. Are you able to drink the recommended daily amount of fluids (48 ounces minimum/day) and protein (60-80 grams/day) as prescribed by the dietitian or nutritional counselor?  Y, Y 4. Are you taking the vitamins and minerals as prescribed?  y 5. Do you have the "on call" number to contact your surgeon if you have a problem or question?  y 6. Are your incisions free of redness, swelling or drainage? y (If steri strips, address that these can fall off, shower as tolerated)  7. Have your bowels moved since your surgery?  No If not, are you passing gas?  Y 8. Are you up and walking 3-4 times per day?  y 589. Do you have an appointment to see a dietitian or nutritional counselor in the next month?  y  The following questions can be added at the center's discharge phone call script at the center's discretion.  The questions are also captured within [employer] project custom fields. 1. Do you have an appointment made to see your surgeon in the next month?  y 2. Were you provided your discharge medications before your surgery or before you were discharged from the hospital and are you taking them without problem?  y 3. Were you provided phone numbers to the clinic/surgeon's office?  y 4. Did you watch the patient education video module in the (clinic, surgeon's office, etc.) before your surgery? y 5. Do you have a discharge checklist that was provided to you in the hospital to reference with instructions on how to take care of yourself after surgery?  y 6. Did you see a dietitian or nutritional counselor while you were in the hospital?  y

## 2013-06-30 ENCOUNTER — Telehealth (INDEPENDENT_AMBULATORY_CARE_PROVIDER_SITE_OTHER): Payer: Self-pay | Admitting: Surgery

## 2013-06-30 NOTE — Telephone Encounter (Signed)
Concerned about phlebitis in vein from IV.  Questions answered

## 2013-07-10 ENCOUNTER — Encounter: Payer: Managed Care, Other (non HMO) | Attending: Surgery

## 2013-07-10 VITALS — Ht 72.0 in | Wt 280.5 lb

## 2013-07-10 DIAGNOSIS — E669 Obesity, unspecified: Secondary | ICD-10-CM

## 2013-07-10 DIAGNOSIS — Z713 Dietary counseling and surveillance: Secondary | ICD-10-CM | POA: Insufficient documentation

## 2013-07-10 NOTE — Patient Instructions (Signed)
Patient to follow Phase 3A-Soft, High Protein Diet and follow-up at [facility] in 6 weeks for 2 months post-op nutrition visit for diet advancement. 

## 2013-07-10 NOTE — Progress Notes (Signed)
Bariatric Class:  Appt start time: 1530 end time:  1630.  2 Week Post-Operative Nutrition Class  Patient was seen on 07/10/2013 for Post-[facility]ative [facility]   Surgery date: 06/26/2013 Surgery type: LAGB Start weight at [facility]: 312 on 12/11/2012 Weight today: 280.5 lbs Weight change: 12.5 lbs Total weight loss: 31.5 lbs  TANITA  BODY COMP RESULTS  06/21/2013 07/10/13   BMI (kg/m^2) 39.7 38.0   Fat Mass (lbs) 106.5 92.5   Fat Free Mass (lbs) 186.5 188.0   Total Body Water (lbs) 136.5 137.5    The following the learning objectives were met by the patient during this course:  Identifies Phase 3A (Soft, High Proteins) Dietary Goals and will begin from 2 weeks post-operatively to 2 months post-operatively  Identifies appropriate sources of fluids and proteins   States protein recommendations and appropriate sources post-operatively  Identifies the need for appropriate texture modifications, mastication, and bite sizes when consuming solids  Identifies appropriate multivitamin and calcium sources post-operatively  Describes the need for physical activity post-operatively and will follow MD recommendations  States when to call healthcare provider regarding medication questions or post-operative complications  Handouts given during class include:  Phase 3A: Soft, High Protein Diet Handout  Follow-Up Plan: Patient will follow-up at [facility] in 4 weeks for 6 week post-op nutrition visit for diet advancement per MD.

## 2013-07-26 ENCOUNTER — Ambulatory Visit (INDEPENDENT_AMBULATORY_CARE_PROVIDER_SITE_OTHER): Payer: Private Health Insurance - Indemnity | Admitting: Surgery

## 2013-07-26 ENCOUNTER — Encounter (INDEPENDENT_AMBULATORY_CARE_PROVIDER_SITE_OTHER): Payer: Self-pay | Admitting: Surgery

## 2013-07-26 VITALS — BP 116/80 | HR 74 | Resp 16 | Ht 72.0 in | Wt 278.6 lb

## 2013-07-26 DIAGNOSIS — Z4651 Encounter for fitting and adjustment of gastric lap band: Secondary | ICD-10-CM

## 2013-07-26 NOTE — Patient Instructions (Signed)

## 2013-07-26 NOTE — Progress Notes (Signed)
Lapband Fill Encounter Problem List:   Patient Active Problem List   Diagnosis Date Noted  . 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 OBESITY 05/25/2007  . ESSENTIAL HYPERTENSION 05/25/2007  . GERD 01/06/2007    Phillip E Johnnye SimaBunnell Jr. Body mass index is 37.78 kg/(m^2). Weight loss since surgery  18.4 lbs  Having regurgitation?:  no  Feel that they need a fill?  yes  Nocturnal reflux?  no  Amount of fill  1.5     Instructions given and weight loss goals discussed.    He is 4 weeks postop.  Using a lot of will power.  First fill done.    Matt B. Daphine DeutscherMartin, MD, FACS

## 2013-08-07 ENCOUNTER — Encounter: Payer: Managed Care, Other (non HMO) | Attending: Surgery | Admitting: Dietician

## 2013-08-07 VITALS — Ht 72.0 in | Wt 276.0 lb

## 2013-08-07 DIAGNOSIS — E669 Obesity, unspecified: Secondary | ICD-10-CM

## 2013-08-07 DIAGNOSIS — Z713 Dietary counseling and surveillance: Secondary | ICD-10-CM | POA: Insufficient documentation

## 2013-08-07 NOTE — Progress Notes (Signed)
  Follow-up visit:  6 Weeks Post-Operative LAGB Surgery  Medical Nutrition Therapy:  Appt start time: 1400 end time:  1430.  Primary concerns today: Post-operative Bariatric Surgery Nutrition Management. Phillip [name] returns today with a 3 lbs weight loss and 12 lb weight loss. Tolerating most foods. Had first on 07/26/13 and states he is not filling a restriction, though is not feeling as hungry. Still feels like he can eat a lot but stops himself from eating a lot. Had some feelings of food being stuck when eating.   Surgery date: 06/26/2013 Surgery type: LAGB [facility]: 312 on 12/11/2012 Weight today: 276.5 lbs lbs Weight change: 3.5 lbs, 12 lbs fat loss Total weight loss: 35.5 lbs  TANITA  BODY COMP RESULTS  06/21/2013 07/10/13 08/07/13   BMI (kg/m^2) 39.7 38.0 37.5   Fat Mass (lbs) 106.5 92.5 80.5   Fat Free Mass (lbs) 186.5 188.0 196.0   Total Body Water (lbs) 136.5 137.5 143.5    Preferred Learning Style:   No preference indicated   Learning Readiness:  Ready  24-hr recall: B (AM): protein shake (premier) 30 g Snk ( AM):none  L (PM): Protein shake 30 g Snk (PM): maybe EAS shake 17 g (rare) D (PM): 3 oz chicken salad 21 g Snk ( PM):none  Fluid intake: 22 oz protein shake, at least 80 oz water  Estimated total protein intake: at least 80 g protein  Medications: see list  Supplementation: taking  Using straws: tried one time, didn't feel good  Drinking while eating: No Hair loss: No Carbonated beverages: 1 soda (shook up to remove carbonation in a pinch), not usually  N/V/D/C: had some regurgitation if he eats too quickly, having constipation and taking Benefiber  Dumping syndrome: none Last Lap-Band fill: 1.5 cc on 4/23  Recent physical activity:  walking a lot at work, treadmill and weights 3 x week for 60 minutes  Progress Towards Goal(s):  In progress.  Handouts given during visit include:  Phase 3B High Protein + Non Starchy Vegetables   Nutritional  Diagnosis:  [place]-3.3 Overweight/obesity related to past poor dietary habits and physical inactivity as evidenced by patient w/ recent LAGB surgery following dietary guidelines for continued weight loss.    Intervention:  Nutrition education/diet advancement  Teaching Method Utilized:  Visual Auditory Hands on  Barriers to learning/adherence to lifestyle change: none  Demonstrated degree of understanding via:  Teach Back   Monitoring/Evaluation:  Dietary intake, exercise, lap band fills, and body weight. Follow up in 6 weeks for 3 month post-op visit.

## 2013-08-07 NOTE — Patient Instructions (Addendum)
Goals:  Follow Phase 3B: High Protein + Non-Starchy Vegetables  Eat 3-6 small meals/snacks, every 3-5 hrs  Increase lean protein foods to meet 80g goal  Increase fluid intake to 64oz +  Avoid drinking 15 minutes before, during and 30 minutes after eating  Aim for >30 min of physical activity daily  Surgery date: 06/26/2013 Surgery type: LAGB [facility] on 12/11/2012 Weight today: 276.5 lbs lbs Weight change: 3.5 lbs, 12 lbs fat loss Total weight loss: 35.5 lbs  TANITA  BODY COMP RESULTS  06/21/2013 07/10/13 08/07/13   BMI (kg/m^2) 39.7 38.0 37.5   Fat Mass (lbs) 106.5 92.5 80.5   Fat Free Mass (lbs) 186.5 188.0 196.0   Total Body Water (lbs) 136.5 137.5 143.5

## 2013-08-23 ENCOUNTER — Encounter (INDEPENDENT_AMBULATORY_CARE_PROVIDER_SITE_OTHER): Payer: Self-pay

## 2013-08-23 ENCOUNTER — Ambulatory Visit (INDEPENDENT_AMBULATORY_CARE_PROVIDER_SITE_OTHER): Payer: Private Health Insurance - Indemnity | Admitting: Physician Assistant

## 2013-08-23 VITALS — BP 126/70 | HR 76 | Temp 98.4°F | Ht 72.0 in | Wt 274.0 lb

## 2013-08-23 DIAGNOSIS — Z4651 Encounter for fitting and adjustment of gastric lap band: Secondary | ICD-10-CM

## 2013-08-23 NOTE — Progress Notes (Signed)
  HISTORY: Phillip [name] is a 38 y.o.male who received an AP-Standard lap-band in March 2015 by Dr. Daphine DeutscherMartin. He comes in today with 4 lbs weight loss since his last visit in April. He's been exercising frequently and reports his clothing fitting much more loosely. He's gone down one pants size. He has no regurgitation or reflux complaints unless he eats too quickly or chews inadequately. He'd like a fill today.  VITAL SIGNS: Filed Vitals:   08/23/13 1218  BP: 126/70  Pulse: 76  Temp: 98.4 F (36.9 C)    PHYSICAL EXAM: Physical exam reveals a very well-appearing 38 y.o.male in no apparent distress Neurologic: Awake, alert, oriented Psych: Bright affect, conversant Respiratory: Breathing even and unlabored. No stridor or wheezing Abdomen: Soft, nontender, nondistended to palpation. Incisions well-healed. No incisional hernias. Port easily palpated. Extremities: Atraumatic, good range of motion.  ASSESMENT: 38 y.o.  male  s/p AP-Standard lap-band.   PLAN: The patient's port was accessed with a 20G Huber needle without difficulty. Clear fluid was aspirated and 0.75 mL saline was added to the port. The patient was able to swallow water without difficulty following the procedure and was instructed to take clear liquids for the next 24-48 hours and advance slowly as tolerated.

## 2013-08-23 NOTE — Patient Instructions (Signed)

## 2013-09-20 ENCOUNTER — Encounter (INDEPENDENT_AMBULATORY_CARE_PROVIDER_SITE_OTHER): Payer: Private Health Insurance - Indemnity

## 2013-10-04 ENCOUNTER — Ambulatory Visit
Admission: RE | Admit: 2013-10-04 | Discharge: 2013-10-04 | Disposition: A | Discharge status: Home / Self Care | Payer: Managed Care, Other (non HMO) | Source: Ambulatory Visit | Attending: Physician Assistant | Admitting: Physician Assistant

## 2013-10-04 ENCOUNTER — Encounter: Payer: Managed Care, Other (non HMO) | Attending: Surgery | Admitting: Dietician

## 2013-10-04 ENCOUNTER — Ambulatory Visit (INDEPENDENT_AMBULATORY_CARE_PROVIDER_SITE_OTHER): Payer: Private Health Insurance - Indemnity | Admitting: Physician Assistant

## 2013-10-04 ENCOUNTER — Encounter (INDEPENDENT_AMBULATORY_CARE_PROVIDER_SITE_OTHER): Payer: Self-pay

## 2013-10-04 VITALS — BP 120/85 | HR 83 | Temp 97.5°F | Resp 14 | Ht 72.0 in | Wt 268.0 lb

## 2013-10-04 VITALS — Ht 72.0 in | Wt 264.5 lb

## 2013-10-04 DIAGNOSIS — Z4651 Encounter for fitting and adjustment of gastric lap band: Secondary | ICD-10-CM

## 2013-10-04 DIAGNOSIS — E669 Obesity, unspecified: Secondary | ICD-10-CM

## 2013-10-04 DIAGNOSIS — Z713 Dietary counseling and surveillance: Secondary | ICD-10-CM | POA: Insufficient documentation

## 2013-10-04 NOTE — Patient Instructions (Signed)
Obtain your x-ray today. Take clear liquids for the remainder of the day. Thin protein shakes are ok to start this evening for your evening meal. Have one also for breakfast tomorrow morning. Try yogurt, cottage cheese or foods of similar consistency for lunch tomorrow. Slowly and carefully advance your diet thereafter. Call us if you have persistent vomiting or regurgitation, night cough or reflux symptoms. Return as scheduled or sooner if you notice no changes in hunger/portion sizes.

## 2013-10-04 NOTE — Progress Notes (Addendum)
  HISTORY: Phillip [name] is a 38 y.o.male who received an AP-Standard lap-band in March 2015 by Dr. Daphine DeutscherMartin. He comes in today with 6 lbs weight loss since his last visit in mid May. He reports having good restriction with good control of hunger until 4 days ago when he noticed an almost instant increase in his hunger and being able to eat things like bread which he was unable to do previously. He had no traumatic incidents or other issues with the band prior to this. No regurgitation or reflux.  VITAL SIGNS: Filed Vitals:   10/04/13 1115  BP: 120/85  Pulse: 83  Temp: 97.5 F (36.4 C)  Resp: 14    PHYSICAL EXAM: Physical exam reveals a very well-appearing 38 y.o.male in no apparent distress Neurologic: Awake, alert, oriented Psych: Bright affect, conversant Respiratory: Breathing even and unlabored. No stridor or wheezing Abdomen: Soft, nontender, nondistended to palpation. Incisions well-healed. No incisional hernias. Port easily palpated. Extremities: Atraumatic, good range of motion.  ASSESMENT: 38 y.o.  male  s/p AP-Standard lap-band.   PLAN: I'm concerned about a leak in the system. The patient's port was accessed with a 20G Huber needle without difficulty. 3.25 mL clear fluid was aspirated, which is consistent with his fill volumes. I'm not certain why he had such sudden changes in his restriction. I think a fill would be in order. As such, his port was accessed and 1 mL saline was added to the port to give a total predicted volume of 4.25 mL. The patient was able to swallow water without difficulty following the procedure and was instructed to take clear liquids for the next 24-48 hours and advance slowly as tolerated. I've ordered a KUB to assess band position. We'll have him back in two weeks for re-evaluation.   KUB shows the lap band position is okay.  Ovidio Kinavid Newman, MD, [facility] Pager: [phone] Office phone:  [phone]

## 2013-10-04 NOTE — Progress Notes (Signed)
  Follow-up visit:  3 Months Post-Operative LAGB Surgery  Medical Nutrition Therapy:  Appt start time: 1700 end time:  1720.  Primary concerns today: Post-operative Bariatric Surgery Nutrition Management. Faizon returns today with a 12 lbs weight loss. Had 2 fills before Monday and then suddenly got very hungry. Before Monday felt like he was in the "green zone". Had another fill today. Still tolerating most foods.  Has not been able to work out lately d/t work schedule.   Surgery date: 06/26/2013 Surgery type: [facility] on 12/11/2012 Weight today: 264.5  Weight change: 12 lbs Total weight loss: 47.5 lbs  TANITA  BODY COMP RESULTS  06/21/2013 07/10/13 08/07/13 10/04/13   BMI (kg/m^2) 39.7 38.0 37.5 35.9   Fat Mass (lbs) 106.5 92.5 80.5 75.5   Fat Free Mass (lbs) 186.5 188.0 196.0 189.0   Total Body Water (lbs) 136.5 137.5 143.5 138.5    Preferred Learning Style:   No preference indicated   Learning Readiness:  Ready  24-hr recall: B (AM): protein shake (premier) 30 g Snk ( AM):Not normally  L (PM): Protein shake 17 g Snk (PM): none usually  D (PM): 3 oz chicken 21 g with salads/vegetables Snk ( PM):none  Fluid intake: 22 oz protein shake, water, at least 80 oz water  Estimated total protein intake: ~ 68 g protein  Medications: see list  Supplementation: taking, though will forget calcium sometimes  Using straws: Very rarely Drinking while eating: Not before this week when he had another fill Hair loss: maybe thinning a bit Carbonated beverages: No N/V/D/C: had some regurgitation if he eats too quickly or too dry Dumping syndrome: none Last Lap-Band fill: 1.0 cc on 10/04/13  Recent physical activity:  walking a lot at work, has not been able to work out   Progress Towards Goal(s):  In progress.   Nutritional Diagnosis:  [place]-3.3 Overweight/obesity related to past poor dietary habits and physical inactivity as evidenced by patient w/ recent LAGB surgery  following dietary guidelines for continued weight loss.    Intervention:  Nutrition education/diet advancement  Teaching Method Utilized:  Visual Auditory Hands on  Barriers to learning/adherence to lifestyle change: none  Demonstrated degree of understanding via:  Teach Back   Monitoring/Evaluation:  Dietary intake, exercise, lap band fills, and body weight. Follow up in 3 months for 6 month post-op visit.

## 2013-10-04 NOTE — Patient Instructions (Addendum)
Goals:  Follow Phase 3B: High Protein + Non-Starchy Vegetables  Eat 3-6 small meals/snacks, every 3-5 hrs  Increase lean protein foods to meet 80g goal (add 12 grams of protein for a snack)  Increase fluid intake to 64oz +  Avoid drinking 15 minutes before, during and 30 minutes after eating  Aim for >30 min of physical activity daily  Eat protein first, then non starchy vegetables.  Surgery date: 06/26/2013 Surgery type: [facility]B [facility] 12[facility]11/2012 Weight today: 264.5  Weight change: 12 lbs Total weight loss: 47.5 lbs  TANITA  BODY COMP RESULTS  06/21/2013 07/10/13 08/07/13 10/04/13   BMI (kg/m^2) 39.7 38.0 37.5 35.9   Fat Mass (lbs) 106.5 92.5 80.5 75.5   Fat Free Mass (lbs) 186.5 188.0 196.0 189.0   Total Body Water (lbs) 136.5 137.5 143.5 138.5

## 2013-10-18 ENCOUNTER — Encounter (INDEPENDENT_AMBULATORY_CARE_PROVIDER_SITE_OTHER): Payer: Private Health Insurance - Indemnity

## 2013-10-18 ENCOUNTER — Encounter (INDEPENDENT_AMBULATORY_CARE_PROVIDER_SITE_OTHER): Payer: Self-pay

## 2013-10-18 ENCOUNTER — Ambulatory Visit (INDEPENDENT_AMBULATORY_CARE_PROVIDER_SITE_OTHER): Payer: Private Health Insurance - Indemnity | Admitting: Physician Assistant

## 2013-10-18 VITALS — BP 118/84 | HR 72 | Temp 98.6°F | Resp 15 | Ht 72.0 in | Wt 263.2 lb

## 2013-10-18 DIAGNOSIS — Z9884 Bariatric surgery status: Secondary | ICD-10-CM

## 2013-10-18 NOTE — Patient Instructions (Signed)
Return in one month. Focus on good food choices as well as physical activity. Return sooner if you have an increase in hunger, portion sizes or weight. Return also for difficulty swallowing, night cough, reflux.   

## 2013-10-18 NOTE — Progress Notes (Signed)
  HISTORY: Phillip [name] is a 38 y.o.male who received an AP-Standard lap-band in March 2015 by Dr. Daphine DeutscherMartin. The patient has lost 5 lbs since their last visit in early July, and has lost 34 lbs since surgery. He reports having some solid food dysphagia for about a week after his last fill, which fortunately has resolved. He is able to eat solids without difficulty, but he is pleased with his level of restriction now. It is keeping his hunger under good control and his portion sizes are remaining small. He continues to exercise regularly.  VITAL SIGNS: Filed Vitals:   10/18/13 1521  BP: 118/84  Pulse: 72  Temp: 98.6 F (37 C)  Resp: 15    PHYSICAL EXAM: Physical exam reveals a very well-appearing 38 y.o.male in no apparent distress Neurologic: Awake, alert, oriented Psych: Bright affect, conversant Respiratory: Breathing even and unlabored. No stridor or wheezing Extremities: Atraumatic, good range of motion. Skin: Warm, Dry, no rashes Musculoskeletal: Normal gait, Joints normal  ASSESMENT: 38 y.o.  male  s/p AP-Standard lap-band.   PLAN: As he's in the green zone now, we will defer a fill. I've set him up for a return visit in one month. He would like to see how things are going in three weeks - if he's still in the green zone, he'd like to extend his appointment out a bit farther, which is reasonable.

## 2013-11-15 ENCOUNTER — Encounter (INDEPENDENT_AMBULATORY_CARE_PROVIDER_SITE_OTHER): Payer: Private Health Insurance - Indemnity

## 2013-12-13 ENCOUNTER — Encounter (INDEPENDENT_AMBULATORY_CARE_PROVIDER_SITE_OTHER): Payer: Private Health Insurance - Indemnity

## 2014-01-07 ENCOUNTER — Encounter: Payer: Managed Care, Other (non HMO) | Attending: Surgery | Admitting: Dietician

## 2014-01-07 DIAGNOSIS — Z6835 Body mass index (BMI) 35.0-35.9, adult: Secondary | ICD-10-CM | POA: Diagnosis not present

## 2014-01-07 DIAGNOSIS — Z713 Dietary counseling and surveillance: Secondary | ICD-10-CM | POA: Insufficient documentation

## 2014-01-07 NOTE — Progress Notes (Signed)
  Follow-up visit:  6 Months Post-Operative LAGB Surgery  Medical Nutrition Therapy:  Appt start time: 1625 end time:  1650.  Primary concerns today: Post-operative Bariatric Surgery Nutrition Management. Phillip [name] returns today with a 8.5 lbs weight loss. Had a fill last month and feels like he is in the green zone. Weight loss is slow and steady. Staying away from breads. Sometimes beans or beef work and sometimes it doesn't.  Has not been able to work out lately d/t work schedule. Will not eat sometimes if he is busy. Tries to eat better on the weekend. Has been eating more soups.   Surgery date: 06/26/2013 Surgery type: [facility]B [facility] on 12/11/2012 Weight today: 256.0 lbs  Weight change: 8.5 lbs Total weight loss: 56 lbs  TANITA  BODY COMP RESULTS  06/21/2013 07/10/13 08/07/13 10/04/13 01/07/14   BMI (kg/m^2) 39.7 38.0 37.5 35.9 34.7   Fat Mass (lbs) 106.5 92.5 80.5 75.5 72.5   Fat Free Mass (lbs) 186.5 188.0 196.0 189.0 183.5   Total Body Water (lbs) 136.5 137.5 143.5 138.5 134.5    Preferred Learning Style:   No preference indicated   Learning Readiness:  Ready  24-hr recall: B (AM): protein shake (premier) 30 g or eggs and bacon on the weekend Snk ( AM):Not normally  L (PM): Protein shake 17 g or chicken salad or protein bar Pure Protein  Snk (PM): none usually  D (PM): 3 oz chicken/meat  21 g with salads/vegetables Snk ( PM):none  Fluid intake: 22 oz protein shake, water, at least 80 oz water (most days) Estimated total protein intake: at least  80g protein  Medications: see list  Supplementation: taking  Using straws: No Drinking while eating: No Hair loss: maybe thinning a bit Carbonated beverages: No N/V/D/C: had some regurgitation if he eats too quickly or too dry Dumping syndrome: none Last Lap-Band fill: had another fill last month  Recent physical activity:  walking a lot at work, has not been able to work out   Progress Towards Goal(s):  In  progress.   Nutritional Diagnosis:  [place]-3.3 Overweight/obesity related to past poor dietary habits and physical inactivity as evidenced by patient w/ recent LAGB surgery following dietary guidelines for continued weight loss.    Intervention:  Nutrition education/diet reinforcement Plan:  Follow Phase 3B: High Protein + Non-Starchy Vegetables  Eat 3-6 small meals/snacks, every 3-5 hrs  Increase lean protein foods to meet 80g goal   Increase fluid intake to 64oz +  Avoid drinking 15 minutes before, during and 30 minutes after eating  Aim for >30 min of physical activity daily  Eat protein first, then non starchy vegetables  Try to in an afternoon snack (drive home or break in the afternoon). Shake or a bar.  Teaching Method Utilized:  Visual Auditory Hands on  Barriers to learning/adherence to lifestyle change: none  Demonstrated degree of understanding via:  Teach Back   Monitoring/Evaluation:  Dietary intake, exercise, lap band fills, and body weight. Follow up in 3 months for 9 month post-op visit.

## 2014-01-07 NOTE — Patient Instructions (Addendum)
Goals:  Follow Phase 3B: High Protein + Non-Starchy Vegetables  Eat 3-6 small meals/snacks, every 3-5 hrs  Increase lean protein foods to meet 80g goal   Increase fluid intake to 64oz +  Avoid drinking 15 minutes before, during and 30 minutes after eating  Aim for >30 min of physical activity daily  Eat protein first, then non starchy vegetables  Try to in an afternoon snack (drive home or break in the afternoon). Shake or a bar.  Surgery date: 06/26/2013 Surgery type: LAGB [facility] on 12/11/2012 Weight today: 256.0 lbs  Weight change: 8.5 lbs Total weight loss: 56 lbs  TANITA  BODY COMP RESULTS  06/21/2013 07/10/13 08/07/13 10/04/13 01/07/14   BMI (kg/m^2) 39.7 38.0 37.5 35.9 34.7   Fat Mass (lbs) 106.5 92.5 80.5 75.5 72.5   Fat Free Mass (lbs) 186.5 188.0 196.0 189.0 183.5   Total Body Water (lbs) 136.5 137.5 143.5 138.5 134.5

## 2014-04-09 ENCOUNTER — Encounter: Payer: Managed Care, Other (non HMO) | Attending: Surgery | Admitting: Dietician

## 2014-04-09 DIAGNOSIS — Z6835 Body mass index (BMI) 35.0-35.9, adult: Secondary | ICD-10-CM | POA: Insufficient documentation

## 2014-04-09 DIAGNOSIS — Z713 Dietary counseling and surveillance: Secondary | ICD-10-CM | POA: Insufficient documentation

## 2014-04-09 NOTE — Progress Notes (Signed)
  Follow-up visit:  9 Months Post-Operative LAGB Surgery  Medical Nutrition Therapy:  Appt start time: 500 end time: 515.  Primary concerns today: Post-operative Bariatric Surgery Nutrition Management. Phillip [name] returns today with no weight loss. Has not had any more fills. Tried edamame pasta which he tolerated. Feels fairly ok with his weight and doesn't feel like he needs to lose more weight. Would be ok getting down to into the 230's lbs.   Added more solids foods. Still meeting protein goal even though work schedule is busy.   Surgery date: 06/26/2013 Surgery type: [facility]B [facility] on 12/11/2012 Weight today: 256.0 lbs  Weight change: 8.5 lbs Total weight loss: 56 lbs Weight loss goal: in the 230s  TANITA  BODY COMP RESULTS  06/21/2013 07/10/13 08/07/13 10/04/13 01/07/14 04/09/14   BMI (kg/m^2) 39.7 38.0 37.5 35.9 34.7 34.7   Fat Mass (lbs) 106.5 92.5 80.5 75.5 72.5 75.5   Fat Free Mass (lbs) 186.5 188.0 196.0 189.0 183.5 180.5   Total Body Water (lbs) 136.5 137.5 143.5 138.5 134.5 132.0    Preferred Learning Style:   No preference indicated   Learning Readiness:  Ready  24-hr recall: B (AM): protein shake (premier) 30 g or eggs and bacon on the weekend Snk ( AM):Not normally  L (PM): Protein shake 17 g or chicken salad or protein bar Pure Protein  Snk (PM): nuts sometimes D (PM): 3 oz chicken/meat  21 g with salads/vegetables Snk ( PM):none  Fluid intake: 22 oz protein shake, water, at least 80 oz water (most days) Estimated total protein intake: at least  80g protein  Medications: see list  Supplementation: taking  Using straws: No Drinking while eating: No Hair loss: maybe thinning a bit Carbonated beverages: No N/V/D/C: had some regurgitation if he eats too quickly or too dry Dumping syndrome: none Last Lap-Band fill: last lap fill was September  Recent physical activity:  walking a lot at work, has not been able to work out   Progress Towards Goal(s):   In progress.   Nutritional Diagnosis:  [place]-3.3 Overweight/obesity related to past poor dietary habits and physical inactivity as evidenced by patient w/ recent LAGB surgery following dietary guidelines for continued weight loss.    Intervention:  Nutrition education/diet reinforcement Plan:  Follow Phase 3B: High Protein + Non-Starchy Vegetables  Eat 3-6 small meals/snacks, every 3-5 hrs  Increase lean protein foods to meet 80g goal   Increase fluid intake to 64oz +  Avoid drinking 15 minutes before, during and 30 minutes after eating  Aim for >30 min of physical activity daily  Eat protein first, then non starchy vegetables  Try to in an afternoon snack (drive home or break in the afternoon). Shake or a bar.  Teaching Method Utilized:  Visual Auditory Hands on  Barriers to learning/adherence to lifestyle change: none  Demonstrated degree of understanding via:  Teach Back   Monitoring/Evaluation:  Dietary intake, exercise, lap band fills, and body weight. Follow up in 3 months for 12 month post-op visit.

## 2014-04-09 NOTE — Patient Instructions (Addendum)
Goals:  Follow Phase 3B: High Protein + Non-Starchy Vegetables  Eat 3-6 small meals/snacks, every 3-5 hrs  Increase lean protein foods to meet 80g goal   Increase fluid intake to 64oz +  Avoid drinking 15 minutes before, during and 30 minutes after eating  Aim for >30 min of physical activity daily (try after work)  Eat protein first, then non starchy vegetables

## 2014-07-15 ENCOUNTER — Ambulatory Visit: Payer: Managed Care, Other (non HMO) | Admitting: Dietician

## 2014-12-14 IMAGING — CR DG CHEST 2V
2 series · 2 of 2 positions shown · non-contrast
Comparison: 04/15/2004

CLINICAL DATA: Bariatric screening, history hypertension,
hyperlipidemia, GERD, hiatal hernia

EXAM:
CHEST  2 VIEW

[view not recorded (1 of 2)]
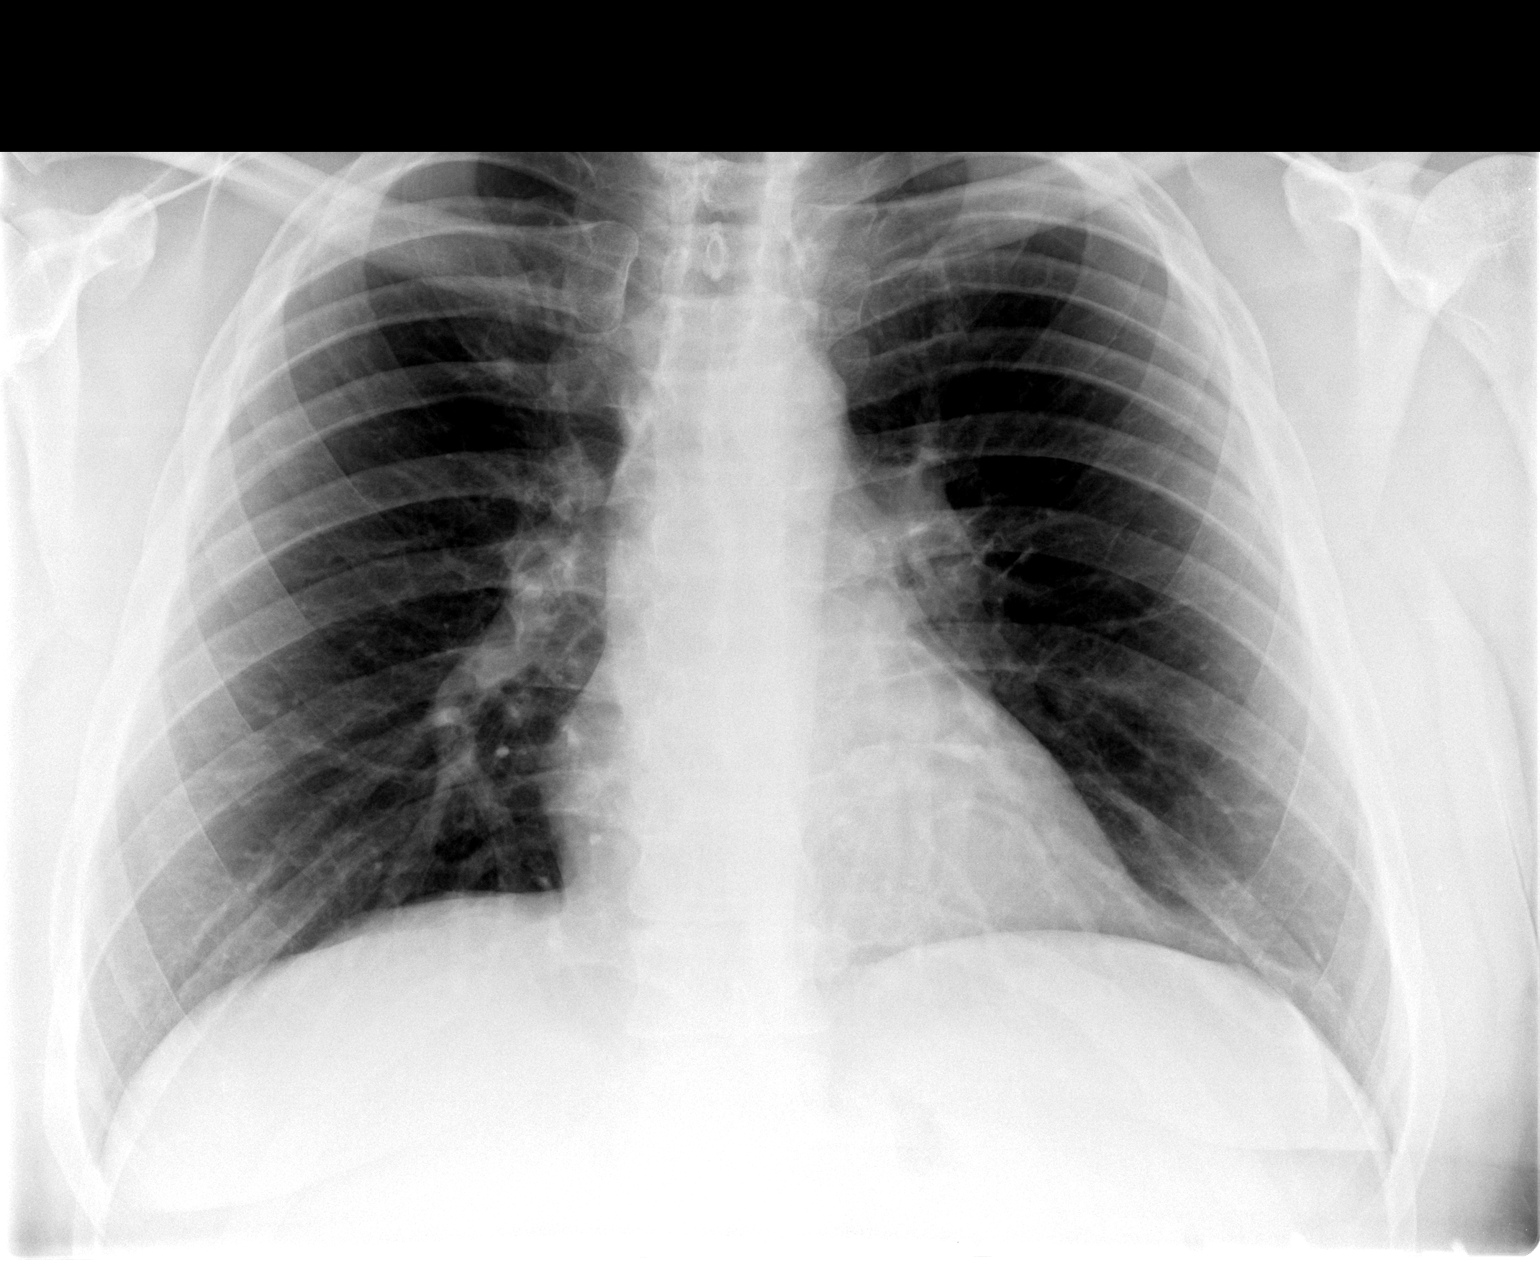

[view not recorded (2 of 2)]
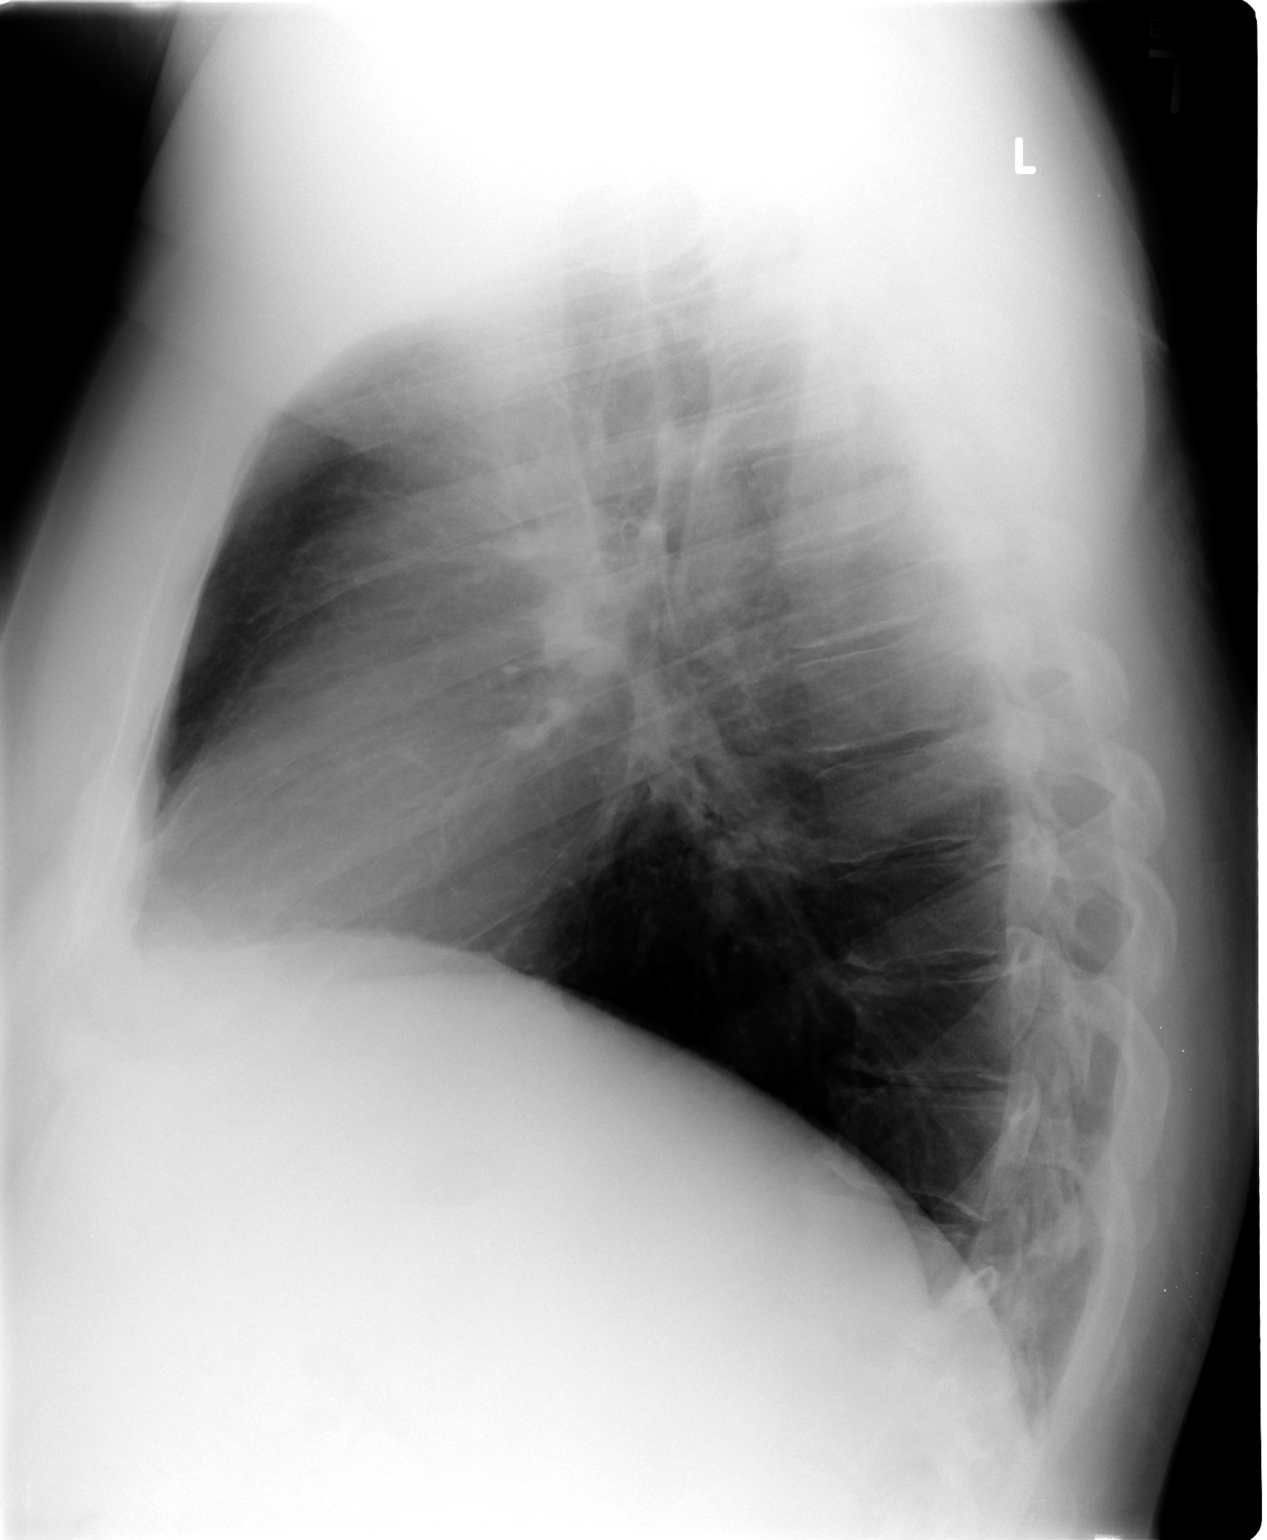

[2 of 2 positions shown; findings below may reference images not displayed]

FINDINGS: Normal heart size, mediastinal contours, and pulmonary vascularity.

Lungs clear.

No pleural effusion or pneumothorax.

Bones unremarkable.
IMPRESSION: Normal exam.

## 2016-01-29 ENCOUNTER — Encounter (HOSPITAL_COMMUNITY): Payer: Self-pay

## 2016-08-05 ENCOUNTER — Other Ambulatory Visit: Payer: Self-pay | Admitting: Internal Medicine

## 2016-08-06 ENCOUNTER — Other Ambulatory Visit: Payer: Self-pay | Admitting: Internal Medicine

## 2016-08-06 DIAGNOSIS — R945 Abnormal results of liver function studies: Secondary | ICD-10-CM

## 2016-08-06 DIAGNOSIS — R7989 Other specified abnormal findings of blood chemistry: Secondary | ICD-10-CM

## 2016-10-14 ENCOUNTER — Ambulatory Visit
Admission: RE | Admit: 2016-10-14 | Discharge: 2016-10-14 | Disposition: A | Discharge status: Home / Self Care | Payer: Managed Care, Other (non HMO) | Source: Ambulatory Visit | Attending: Internal Medicine | Admitting: Internal Medicine

## 2016-10-14 DIAGNOSIS — R7989 Other specified abnormal findings of blood chemistry: Secondary | ICD-10-CM

## 2016-10-14 DIAGNOSIS — R945 Abnormal results of liver function studies: Secondary | ICD-10-CM

## 2017-01-25 ENCOUNTER — Encounter (HOSPITAL_COMMUNITY): Payer: Self-pay

## 2018-11-10 ENCOUNTER — Other Ambulatory Visit: Payer: Self-pay

## 2018-11-10 DIAGNOSIS — Z20822 Contact with and (suspected) exposure to covid-19: Secondary | ICD-10-CM

## 2018-11-11 LAB — NOVEL CORONAVIRUS, NAA: SARS-CoV-2, NAA: NOT DETECTED

## 2019-01-01 ENCOUNTER — Ambulatory Visit: Payer: 59 | Admitting: Adult Health

## 2019-01-10 ENCOUNTER — Encounter: Payer: Self-pay | Admitting: Adult Health

## 2019-01-10 ENCOUNTER — Ambulatory Visit (INDEPENDENT_AMBULATORY_CARE_PROVIDER_SITE_OTHER): Payer: 59 | Admitting: Adult Health

## 2019-01-10 ENCOUNTER — Other Ambulatory Visit: Payer: Self-pay

## 2019-01-10 DIAGNOSIS — G4700 Insomnia, unspecified: Secondary | ICD-10-CM

## 2019-01-10 DIAGNOSIS — F331 Major depressive disorder, recurrent, moderate: Secondary | ICD-10-CM

## 2019-01-10 DIAGNOSIS — F411 Generalized anxiety disorder: Secondary | ICD-10-CM | POA: Diagnosis not present

## 2019-01-10 MED ORDER — CLONAZEPAM 0.5 MG PO TABS: ORAL_TABLET | ORAL | 0 refills | Status: DC

## 2019-01-10 MED ORDER — HYDROXYZINE HCL 25 MG PO TABS: ORAL_TABLET | ORAL | 0 refills | Status: DC

## 2019-01-10 MED ORDER — BUPROPION HCL ER (XL) 300 MG PO TB24
300.0000 mg | ORAL_TABLET | Freq: Every morning | ORAL | 2 refills | Status: DC
Start: 2019-01-10 — End: 2019-02-07

## 2019-01-10 NOTE — Progress Notes (Signed)
[facility] MD/PA/NP Initial Note  01/10/2019 6:02 PM AZAR SOUTH  MRN:  [id number]    Virtual Visit via Telephone Note  I connected with pt on 12/14/18 at  3:30 PM EDT by telephone and verified that I am speaking with the correct person using two identifiers.   I discussed the limitations, risks, security and privacy concerns of performing an evaluation and management service by telephone and the availability of in person appointments. I also discussed with the patient that there may be a patient responsible charge related to this service. The patient expressed understanding and agreed to proceed.   I discussed the assessment and treatment plan with the patient. The patient was provided an opportunity to ask questions and all were answered. The patient agreed with the plan and demonstrated an understanding of the instructions.   The patient was advised to call back or seek an in-person evaluation if the symptoms worsen or if the condition fails to improve as anticipated.  I provided 45 minutes of non-face-to-face time during this encounter.  The patient was located at home. The provider was located at [facility].  Chief Complaint:   HPI:   Describes mood today as "ok". Pleasant. Mood symptoms - reports depression, anxiety, and irritability. Stating "I've had sleep issues for years". Was previously seen for mental health issues. Tried and failed "several" medications and has been apprehensive about trying anything new. Went to primary care 6 months ago and told them he was having issues with mood and sleep. They made some recommendations that he didn't feel comfortable with. Stating "I have been trying to manage things on my own". Has started working with a therapist and feels that is helping. Consumes alcohol most days to help with sleep. Also taking Clonazepam 0.5mg  at bedtime some nights. Does not mix alcohol with Clonazepam. Feels like his mood is reflective of loss of sleep.  He and wife doing well. Wife supportive. Stable interest and motivation. Taking medications as prescribed. Working with counselor for past 2 months. Willing to decrease alcohol consumption and work with medication suggestions. Stating "my goal is not to drink every night".  Energy levels stable. Taking supplements - 2 energy drinks daily. Active, does not have a regular exercise routine. 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 Feels ok with the depression.  Enjoys some usual interests and activities. Married 17 years. Spending time with family - wife and 2 children 13 and 8. Both doing online classes. Appetite adequate. Weight loss - 10 pounds over past month. Had lap band surgery 5 years ago.  Sleeping difficulties. Stating "I shoot for 8 hours". Has always struggled with sleep. Has been evaluated for sleep apnea - "does not have it".  Focus and concentration stable - other than days he doesn't sleep. Completing tasks. Managing aspects of household. Working going well. Denies SI or HI. Denies AH or VH.  Medication trials: Melatonin, Ambien, Gabapentin, Seroquel, others  Visit Diagnosis:    ICD-10-CM   1. Major depressive disorder, recurrent episode, moderate (HCC)  F33.1 buPROPion (WELLBUTRIN XL) 300 MG 24 hr tablet  2. Generalized anxiety disorder  F41.1 buPROPion (WELLBUTRIN XL) 300 MG 24 hr tablet    hydrOXYzine (ATARAX/VISTARIL) 25 MG tablet  3. Insomnia, unspecified type  G47.00 clonazePAM (KLONOPIN) 0.5 MG tablet    hydrOXYzine (ATARAX/VISTARIL) 25 MG tablet    Past Psychiatric History: Denies  Past Medical History:  Past Medical History:  Diagnosis Date  . Anxiety   . Borderline high blood  pressure   . Borderline hyperlipidemia   . Depression   . GERD (gastroesophageal reflux disease)     Past Surgical History:  Procedure Laterality Date  . BREATH TEK H PYLORI N/A 12/19/2012   Procedure: BREATH TEK H PYLORI;  Surgeon: Pedro Earls, MD;  Location: [facility];   Service: General;  Laterality: N/A;  Pt is scheduled at 730  . LAPAROSCOPIC GASTRIC BANDING WITH HIATAL HERNIA REPAIR  06/26/2013   Procedure: LAPAROSCOPIC GASTRIC BANDING WITH HIATAL HERNIA REPAIR;  Surgeon: Pedro Earls, MD;  Location: [facility];  Service: General;;  . NO PAST SURGERIES    . UMBILICAL HERNIA REPAIR  06/26/2013   Procedure: HERNIA REPAIR UMBILICAL ADULT;  Surgeon: Pedro Earls, MD;  Location: [facility];  Service: General;;    Family Psychiatric History: Depression, Anxiety  Family History:  Family History  Problem Relation Age of Onset  . Cancer Maternal Grandmother        colon  . COPD Other   . Hypertension Other   . Hyperlipidemia Other   . Obesity Other     Social History:  Social History   Socioeconomic History  . Marital status: Married    Spouse name: Not on file  . Number of children: Not on file  . Years of education: Not on file  . Highest education level: Not on file  Occupational History  . Not on file  Social Needs  . Financial resource strain: Not on file  . Food insecurity    Worry: Not on file    Inability: Not on file  . Transportation needs    Medical: Not on file    Non-medical: Not on file  Tobacco Use  . Smoking status: Never Smoker  . Smokeless tobacco: Former Systems developer    Types: Chew  Substance and Sexual Activity  . Alcohol use: Yes    Comment: about 1 daily  . Drug use: No  . Sexual activity: Not on file  Lifestyle  . Physical activity    Days per week: Not on file    Minutes per session: Not on file  . Stress: Not on file  Relationships  . Social Herbalist on phone: Not on file    Gets together: Not on file    Attends religious service: Not on file    Active member of club or organization: Not on file    Attends meetings of clubs or organizations: Not on file    Relationship status: Not on file  Other Topics Concern  . Not on file  Social History Narrative  . Not on file    Allergies:  Allergies   Allergen Reactions  . Penicillins Hives, Rash and Swelling    Metabolic Disorder Labs: No results found for: HGBA1C, MPG No results found for: PROLACTIN Lab Results  Component Value Date   CHOL 138 01/27/2007   TRIG 64 01/27/2007   HDL 44.1 01/27/2007   CHOLHDL 3.1 CALC 01/27/2007   VLDL 13 01/27/2007   LDLCALC 81 01/27/2007   Lab Results  Component Value Date   TSH 2.522 11/22/2012    Therapeutic Level Labs: No results found for: LITHIUM No results found for: VALPROATE No components found for:  CBMZ  Current Medications: Current Outpatient Medications  Medication Sig Dispense Refill  . clonazePAM (KLONOPIN) 0.5 MG tablet TAKE ONE TABLET AT BEDTIME AS NEEDED FOR SLEEP 30 tablet 0  . buPROPion (WELLBUTRIN XL) 300 MG 24 hr tablet Take 1  tablet (300 mg total) by mouth every morning. 30 tablet 2  . hydrOXYzine (ATARAX/VISTARIL) 25 MG tablet Take one tablet four times daily as needed for sleep/anxiety. 120 tablet 0   No current facility-administered medications for this visit.     Medication Side Effects: none  Orders placed this visit:  No orders of the defined types were placed in this encounter.   Psychiatric Specialty Exam:  ROS  There were no vitals taken for this visit.There is no height or weight on file to calculate BMI.  General Appearance: UTA  Eye Contact:  UTA  Speech:  Clear and Coherent  Volume:  Normal  Mood:  Euthymic  Affect:  UTA  Thought Process:  Goal Directed  Orientation:  Full (Time, Place, and Person)  Thought Content: Logical   Suicidal Thoughts:  No  Homicidal Thoughts:  No  Memory:  WNL  Judgement:  Good  Insight:  Good  Psychomotor Activity:  Normal  Concentration:  Concentration: Good  Recall:  NA  Fund of Knowledge: Good  Language: Good  Assets:  Communication Skills Desire for Improvement Financial Resources/Insurance Housing Intimacy Leisure Time Physical Health Resilience Social  Support Talents/Skills Transportation Vocational/Educational  ADL's:  Intact  Cognition: WNL  Prognosis:  Good   Screenings: None  Receiving Psychotherapy: Yes   Treatment Plan/Recommendations:   Plan:  1. Continue Wellbutrin XL 300mg  daily 2. Continue Coanzepam 0.5mg  at hs  3. Add Hydroxyzine 25mg  capsule for anxiety/insomnia  Continue therapy  RTC 4 weeks  Patient advised to contact office with any questions, adverse effects, or acute worsening in signs and symptoms.   Dorothyann Gibbsegina N Artha Chiasson, NP

## 2019-01-10 NOTE — Progress Notes (Signed)
Referred by [facility]

## 2019-01-11 ENCOUNTER — Other Ambulatory Visit: Payer: Self-pay

## 2019-01-11 DIAGNOSIS — Z20822 Contact with and (suspected) exposure to covid-19: Secondary | ICD-10-CM

## 2019-01-12 ENCOUNTER — Telehealth: Payer: Self-pay | Admitting: Adult Health

## 2019-01-12 LAB — NOVEL CORONAVIRUS, NAA: SARS-CoV-2, NAA: NOT DETECTED

## 2019-01-12 NOTE — Telephone Encounter (Signed)
Pt called to advise his insurance does not cover hydroxyzine-(Atarax, Vistaril) Ask you to sent in something else to pharmacy.

## 2019-01-15 ENCOUNTER — Other Ambulatory Visit: Payer: Self-pay

## 2019-01-15 DIAGNOSIS — G4700 Insomnia, unspecified: Secondary | ICD-10-CM

## 2019-01-15 DIAGNOSIS — F411 Generalized anxiety disorder: Secondary | ICD-10-CM

## 2019-01-15 MED ORDER — HYDROXYZINE HCL 25 MG PO TABS: ORAL_TABLET | ORAL | 0 refills | Status: DC

## 2019-01-15 NOTE — Telephone Encounter (Signed)
Change Rx for Vistaril to Phillip [name] at [facility], pt will use [facility] due to his insurance not covering medication

## 2019-01-15 NOTE — Telephone Encounter (Signed)
Discussed with patient vistaril is cheaper at [facility] using [facility], patient agrees. Submitted Rx to [facility] at [facility]

## 2019-02-07 ENCOUNTER — Other Ambulatory Visit: Payer: Self-pay

## 2019-02-07 DIAGNOSIS — F411 Generalized anxiety disorder: Secondary | ICD-10-CM

## 2019-02-07 DIAGNOSIS — F331 Major depressive disorder, recurrent, moderate: Secondary | ICD-10-CM

## 2019-02-07 MED ORDER — BUPROPION HCL ER (XL) 300 MG PO TB24: 300.0000 mg | ORAL_TABLET | Freq: Every morning | ORAL | 0 refills | Status: DC

## 2019-02-12 ENCOUNTER — Other Ambulatory Visit: Payer: Self-pay

## 2019-02-12 ENCOUNTER — Encounter: Payer: Self-pay | Admitting: Adult Health

## 2019-02-12 ENCOUNTER — Ambulatory Visit (INDEPENDENT_AMBULATORY_CARE_PROVIDER_SITE_OTHER): Payer: 59 | Admitting: Adult Health

## 2019-02-12 DIAGNOSIS — F411 Generalized anxiety disorder: Secondary | ICD-10-CM | POA: Diagnosis not present

## 2019-02-12 DIAGNOSIS — G4700 Insomnia, unspecified: Secondary | ICD-10-CM

## 2019-02-12 DIAGNOSIS — F331 Major depressive disorder, recurrent, moderate: Secondary | ICD-10-CM | POA: Diagnosis not present

## 2019-02-12 MED ORDER — HYDROXYZINE HCL 25 MG PO TABS: ORAL_TABLET | ORAL | 2 refills | Status: DC

## 2019-02-12 MED ORDER — CLONAZEPAM 0.5 MG PO TABS: ORAL_TABLET | ORAL | 2 refills | Status: DC

## 2019-02-12 NOTE — Progress Notes (Signed)
[facility] MD/PA/NP Initial Note  02/12/2019 3:19 PM Phillip [name]  MRN:  [id number]   Chief Complaint:   HPI:   Describes mood today as "ok". Pleasant. Mood symptoms - reports decreased depression, anxiety, and irritability. Stating "I'm doing much better". Feels like current medications are working well - "my sleep is so much better". Feels rested". He and wife doing well. Wife supportive. Stable interest and motivation. Taking medications as prescribed. Working with therapist for past 2 months - can't afford current therapist - out of network. Would like to get set up with new therapist.  Energy levels stable. Decreased energy drinks from twice a day to twice a week. Active, does not have a regular exercise routine. 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 Stating "our busy time started today".  Enjoys some usual interests and activities. Married 17 years. Spending time with family - wife and 2 children 5 and 8.  Both children home schooled.  Appetite adequate. Weight stable - 260-270. Sleep has improved. Feels more rested lately. Averages 8 hours most nights. Focus and concentration stable - better since "sleeping". Performing better at work. Completing tasks. Managing aspects of household. Working going well. Denies SI or HI. Denies AH or VH.  Medication trials: Melatonin, Ambien, Gabapentin, Seroquel, others  Visit Diagnosis:    ICD-10-CM   1. Insomnia, unspecified type  G47.00 clonazePAM (KLONOPIN) 0.5 MG tablet    hydrOXYzine (ATARAX/VISTARIL) 25 MG tablet  2. Generalized anxiety disorder  F41.1 hydrOXYzine (ATARAX/VISTARIL) 25 MG tablet    Past Psychiatric History: Denies  Past Medical History:  Past Medical History:  Diagnosis Date  . Anxiety   . Borderline high blood pressure   . Borderline hyperlipidemia   . Depression   . GERD (gastroesophageal reflux disease)     Past Surgical History:  Procedure Laterality Date  . BREATH TEK H PYLORI N/A 12/19/2012    Procedure: BREATH TEK H PYLORI;  Surgeon: Pedro Earls, MD;  Location: [facility];  Service: General;  Laterality: N/A;  Pt is scheduled at 730  . LAPAROSCOPIC GASTRIC BANDING WITH HIATAL HERNIA REPAIR  06/26/2013   Procedure: LAPAROSCOPIC GASTRIC BANDING WITH HIATAL HERNIA REPAIR;  Surgeon: Pedro Earls, MD;  Location: [facility];  Service: General;;  . NO PAST SURGERIES    . UMBILICAL HERNIA REPAIR  06/26/2013   Procedure: HERNIA REPAIR UMBILICAL ADULT;  Surgeon: Pedro Earls, MD;  Location: [facility];  Service: General;;    Family Psychiatric History: Depression, Anxiety  Family History:  Family History  Problem Relation Age of Onset  . Cancer Maternal Grandmother        colon  . COPD Other   . Hypertension Other   . Hyperlipidemia Other   . Obesity Other     Social History:  Social History   Socioeconomic History  . Marital status: Married    Spouse name: Not on file  . Number of children: Not on file  . Years of education: Not on file  . Highest education level: Not on file  Occupational History  . Not on file  Social Needs  . Financial resource strain: Not on file  . Food insecurity    Worry: Not on file    Inability: Not on file  . Transportation needs    Medical: Not on file    Non-medical: Not on file  Tobacco Use  . Smoking status: Never Smoker  . Smokeless tobacco: Former Systems developer    Types: Chew  Substance  and Sexual Activity  . Alcohol use: Yes    Comment: about 1 daily  . Drug use: No  . Sexual activity: Not on file  Lifestyle  . Physical activity    Days per week: Not on file    Minutes per session: Not on file  . Stress: Not on file  Relationships  . Social Musician on phone: Not on file    Gets together: Not on file    Attends religious service: Not on file    Active member of club or organization: Not on file    Attends meetings of clubs or organizations: Not on file    Relationship status: Not on file  Other Topics Concern   . Not on file  Social History Narrative  . Not on file    Allergies:  Allergies  Allergen Reactions  . Penicillins Hives, Rash and Swelling    Metabolic Disorder Labs: No results found for: HGBA1C, MPG No results found for: PROLACTIN Lab Results  Component Value Date   CHOL 138 01/27/2007   TRIG 64 01/27/2007   HDL 44.1 01/27/2007   CHOLHDL 3.1 CALC 01/27/2007   VLDL 13 01/27/2007   LDLCALC 81 01/27/2007   Lab Results  Component Value Date   TSH 2.522 11/22/2012    Therapeutic Level Labs: No results found for: LITHIUM No results found for: VALPROATE No components found for:  CBMZ  Current Medications: Current Outpatient Medications  Medication Sig Dispense Refill  . buPROPion (WELLBUTRIN XL) 300 MG 24 hr tablet Take 1 tablet (300 mg total) by mouth every morning. 90 tablet 0  . clonazePAM (KLONOPIN) 0.5 MG tablet TAKE ONE TABLET AT BEDTIME AS NEEDED FOR SLEEP 30 tablet 2  . hydrOXYzine (ATARAX/VISTARIL) 25 MG tablet Take one tablet four times daily as needed for sleep/anxiety. 120 tablet 2   No current facility-administered medications for this visit.     Medication Side Effects: none  Orders placed this visit:  No orders of the defined types were placed in this encounter.   Psychiatric Specialty Exam:  Review of Systems  Neurological: Negative for tremors and weakness.    There were no vitals taken for this visit.There is no height or weight on file to calculate BMI.  General Appearance: Neat and Well Groomed  Eye Contact:  Good  Speech:  Clear and Coherent  Volume:  Normal  Mood:  Euthymic  Affect:  Appropriate  Thought Process:  Goal Directed  Orientation:  Full (Time, Place, and Person)  Thought Content: Logical   Suicidal Thoughts:  No  Homicidal Thoughts:  No  Memory:  WNL  Judgement:  Good  Insight:  Good  Psychomotor Activity:  Normal  Concentration:  Concentration: Good  Recall:  NA  Fund of Knowledge: Good  Language: Good  Assets:   Communication Skills Desire for Improvement Financial Resources/Insurance Housing Intimacy Leisure Time Physical Health Resilience Social Support Talents/Skills Transportation Vocational/Educational  ADL's:  Intact  Cognition: WNL  Prognosis:  Good   Screenings: None  Receiving Psychotherapy: Yes   Treatment Plan/Recommendations:   Plan:  1. Continue Wellbutrin XL 300mg  daily 2. Continue Coanzepam 0.5mg  at hs  3. Continue Hydroxyzine 25mg  3 capsule for anxiety/insomnia  Continue therapy  RTC 3 months  Patient advised to contact office with any questions, adverse effects, or acute worsening in signs and symptoms.  Discussed potential benefits, risk, and side effects of benzodiazepines to include potential risk of tolerance and dependence, as well as possible  drowsiness.  Advised patient not to drive if experiencing drowsiness and to take lowest possible effective dose to minimize risk of dependence and tolerance.   Dorothyann Gibbsegina N Braulio Kiedrowski, NP

## 2019-04-30 IMAGING — US US ABDOMEN COMPLETE
2 series · 13 of 25 positions shown · non-contrast
Comparison: None.

CLINICAL DATA: Elevated liver enzymes

EXAM:
ABDOMEN ULTRASOUND COMPLETE

[Series 1: us abdomen complete · 0.23mm/px · 12 of 88 slices shown (1 of 2)]
[im 1/88]
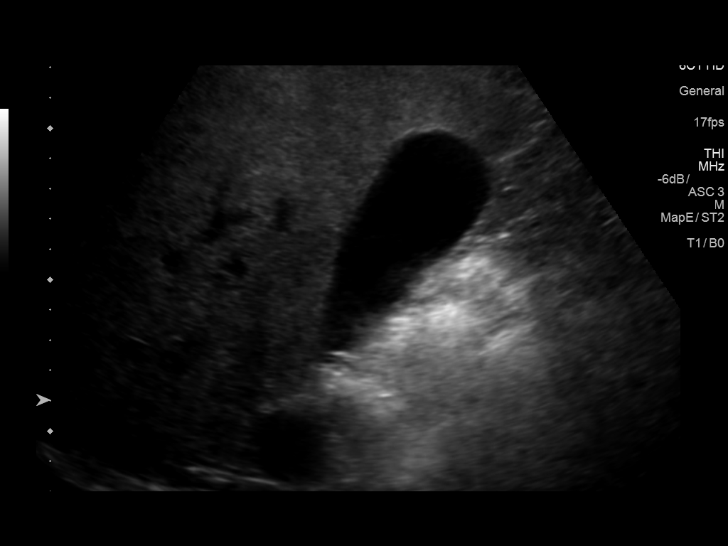
[im 8/88]
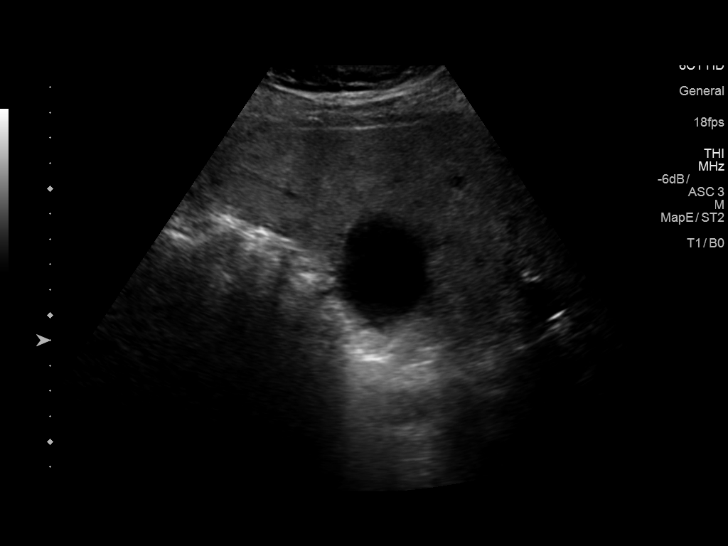
[im 16/88]
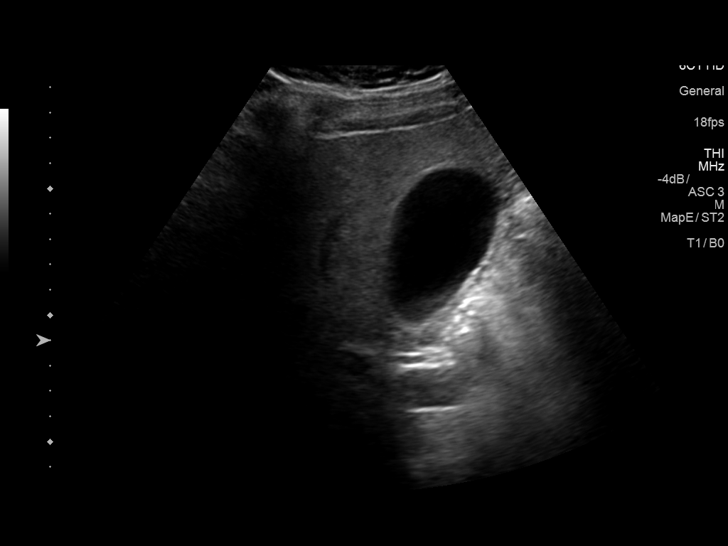
[im 23/88]
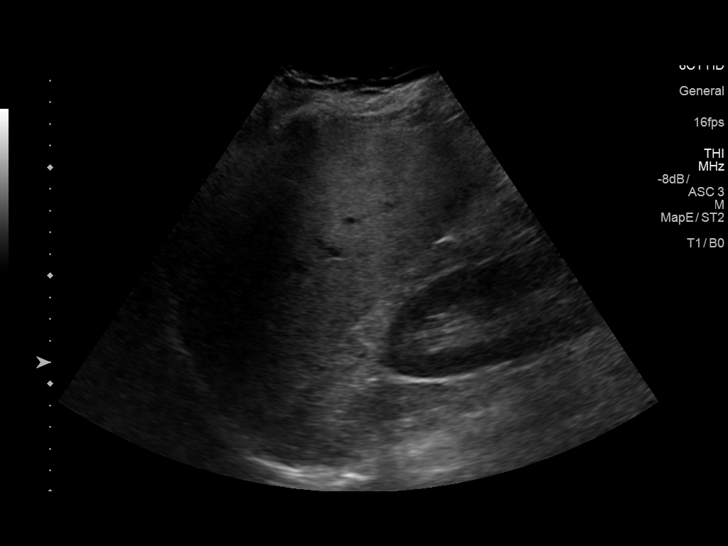
[im 31/88]
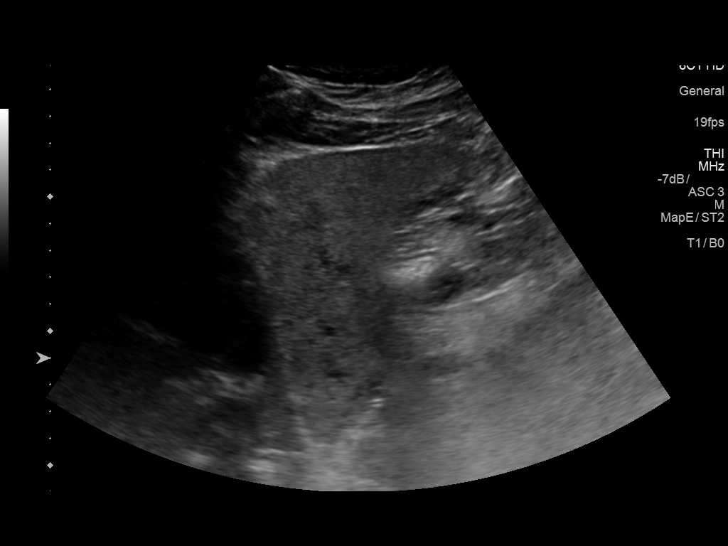
[im 38/88]
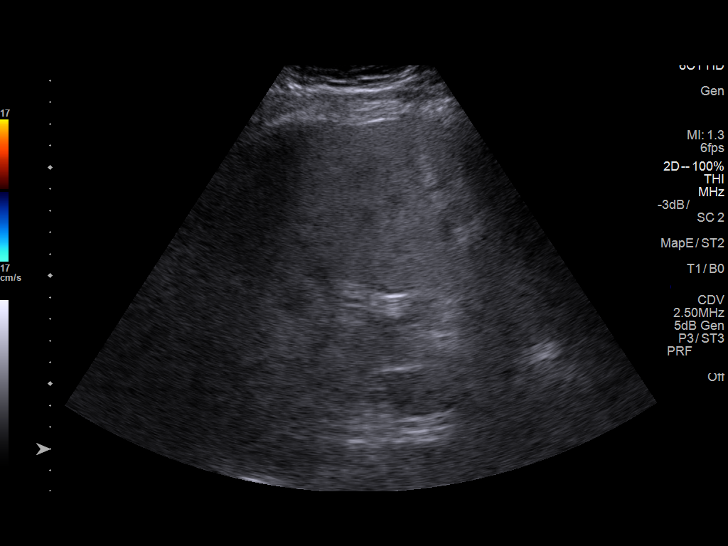
[im 46/88]
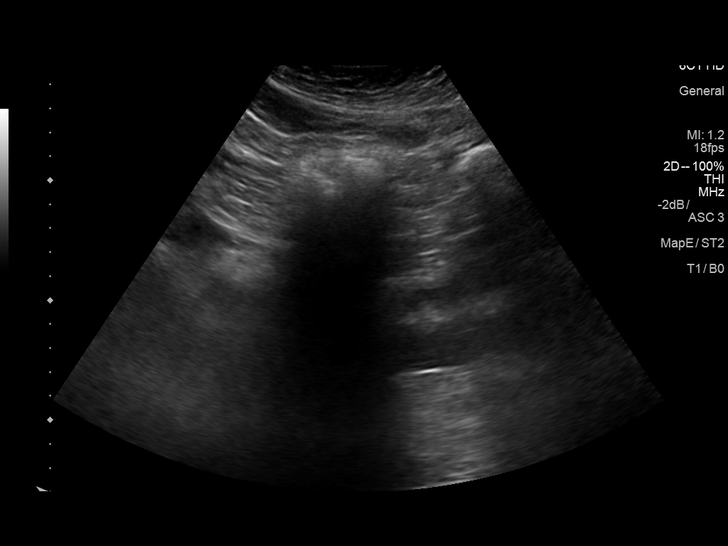
[im 53/88]
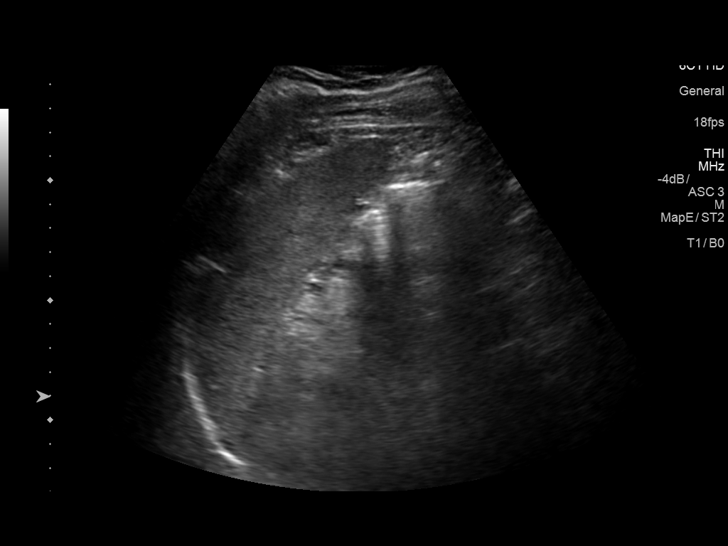
[im 61/88]
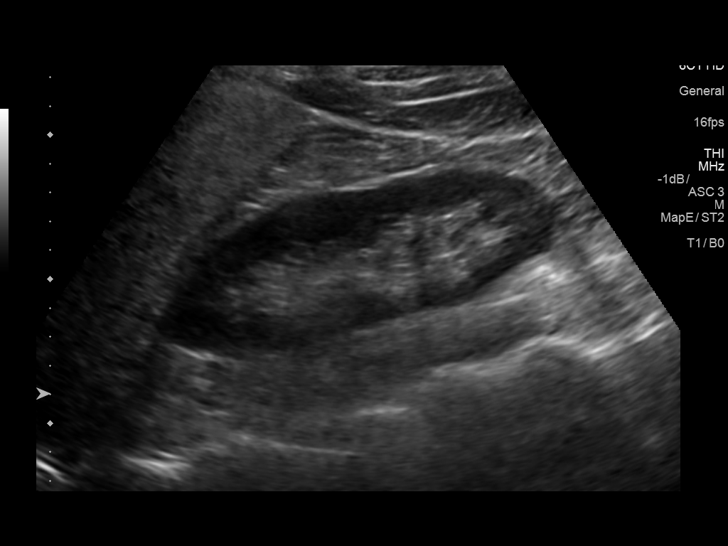
[im 69/88]
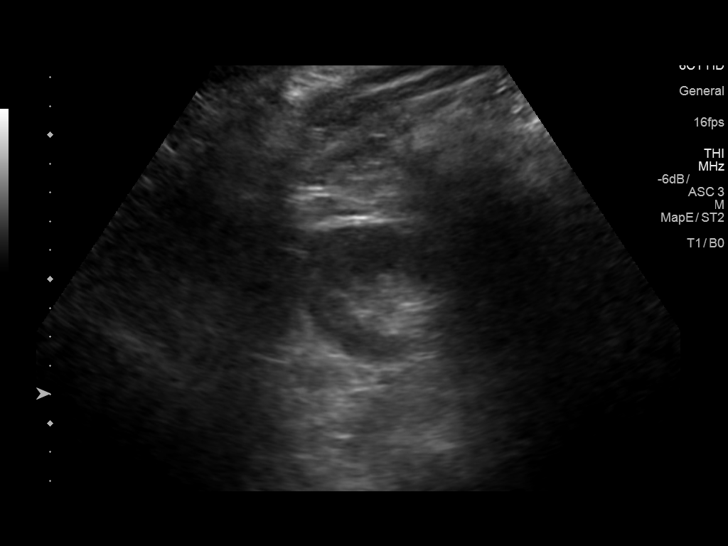
[im 76/88]
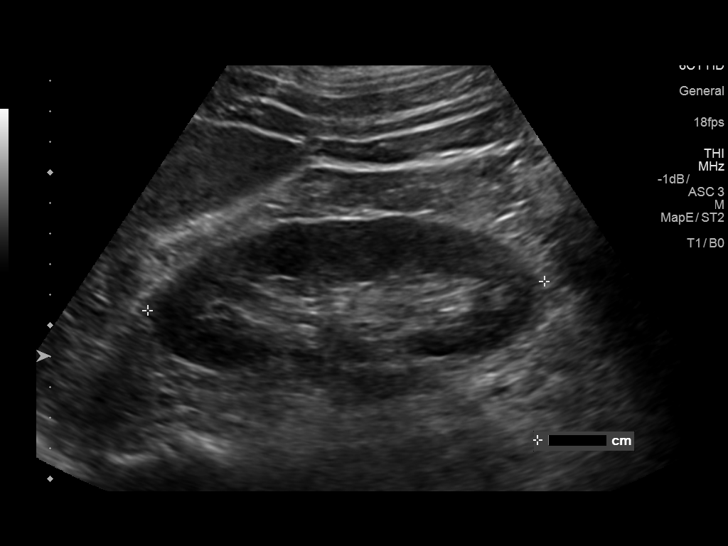
[im 84/88]
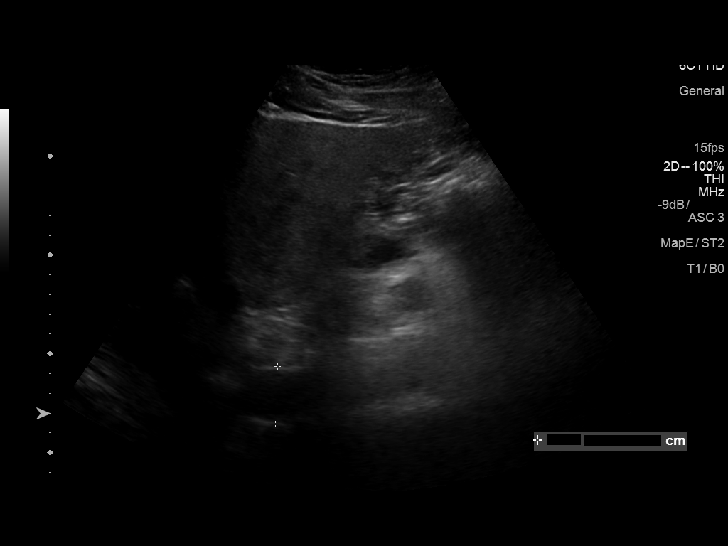

[Series 3: us abdomen complete · 0.28mm/px · 1 of 2 slices shown (2 of 2)]
[im 1/2]
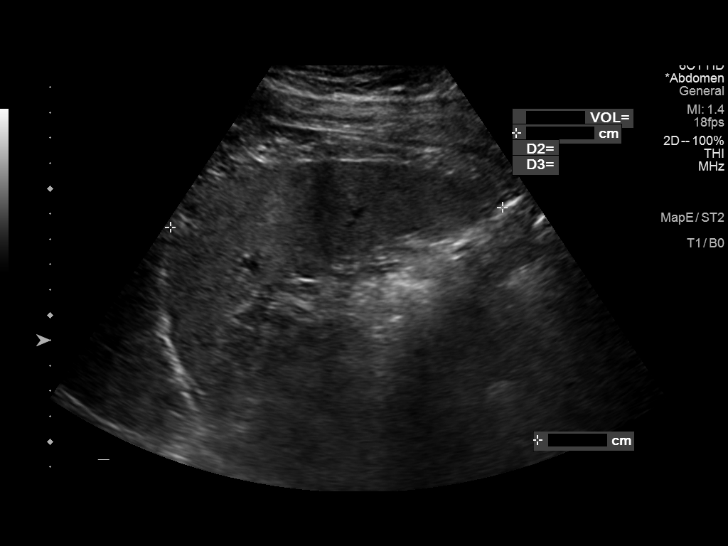

[13 of 25 positions shown; findings below may reference images not displayed]

FINDINGS: Gallbladder: No gallstones or wall thickening visualized. There is
no pericholecystic fluid. No sonographic Murphy sign noted by
sonographer.

Common bile duct: Diameter: 3 mm. There is no intrahepatic, common
hepatic, common bile duct dilatation.

Liver: No focal lesion identified. Liver echogenicity is overall
increased.

IVC: No abnormality visualized. Portions of the infrahepatic
inferior vena cava obscured by gas.

Pancreas: Visualized portion unremarkable. Much of the pancreas is
obscured by gas.

Spleen: Spleen measures 13 x 15 1 x 5.3 cm with a measured splenic
volume 551 cubic cm. No focal lesions are evident.

Right Kidney: Length: 14.7 cm. Echogenicity within normal limits. No
mass or hydronephrosis visualized.

Left Kidney: Length: 12.9 cm. Echogenicity within normal limits. No
mass or hydronephrosis visualized.

Abdominal aorta: No aneurysm visualized.

Other findings: No demonstrable ascites.
IMPRESSION: 1. Liver echogenicity is diffusely increased. This finding may be
seen with hepatic steatosis and/or underlying parenchymal disease.
While no focal liver lesions are evident on this study, it is
cautioned that the sensitivity of ultrasound for detection of focal
liver lesions is diminished in this circumstance.

2.  Splenomegaly.  No focal splenic lesions evident.

3. Most of pancreas obscured by gas. Infrahepatic inferior vena cava
obscured by gas. Visualized portions the structures appear
unremarkable.

4.  Study otherwise unremarkable.

## 2019-05-15 ENCOUNTER — Ambulatory Visit (INDEPENDENT_AMBULATORY_CARE_PROVIDER_SITE_OTHER): Payer: 59 | Admitting: Adult Health

## 2019-05-15 ENCOUNTER — Encounter: Payer: Self-pay | Admitting: Adult Health

## 2019-05-15 DIAGNOSIS — G4700 Insomnia, unspecified: Secondary | ICD-10-CM

## 2019-05-15 DIAGNOSIS — F331 Major depressive disorder, recurrent, moderate: Secondary | ICD-10-CM | POA: Diagnosis not present

## 2019-05-15 DIAGNOSIS — F411 Generalized anxiety disorder: Secondary | ICD-10-CM | POA: Diagnosis not present

## 2019-05-15 DIAGNOSIS — F410 Panic disorder [episodic paroxysmal anxiety] without agoraphobia: Secondary | ICD-10-CM

## 2019-05-15 MED ORDER — CLONAZEPAM 0.5 MG PO TABS: ORAL_TABLET | ORAL | 2 refills | Status: AC

## 2019-05-15 MED ORDER — HYDROXYZINE HCL 25 MG PO TABS: ORAL_TABLET | ORAL | 1 refills | Status: DC

## 2019-05-15 MED ORDER — BUPROPION HCL ER (XL) 300 MG PO TB24: 300.0000 mg | ORAL_TABLET | Freq: Every morning | ORAL | 1 refills | Status: DC

## 2019-05-15 NOTE — Progress Notes (Signed)
Phillip [name] [id number] [birth year] 44 y.o.  Virtual Visit via Telephone Note  I connected with pt on 05/15/19 at  3:40 PM EST by telephone and verified that I am speaking with the correct person using two identifiers.   I discussed the limitations, risks, security and privacy concerns of performing an evaluation and management service by telephone and the availability of in person appointments. I also discussed with the patient that there may be a patient responsible charge related to this service. The patient expressed understanding and agreed to proceed.   I discussed the assessment and treatment plan with the patient. The patient was provided an opportunity to ask questions and all were answered. The patient agreed with the plan and demonstrated an understanding of the instructions.   The patient was advised to call back or seek an in-person evaluation if the symptoms worsen or if the condition fails to improve as anticipated.  I provided 30 minutes of non-face-to-face time during this encounter.  The patient was located at home.  The provider was located at [facility].   Dorothyann Gibbs, NP   Subjective:   Patient ID:  Phillip [name] is a 44 y.o. (DOB 03-Nov-1975) male.  Chief Complaint:  Chief Complaint  Patient presents with  . Anxiety  . Depression  . Insomnia  . Panic Attack    HPI Phillip [name] presents for follow-up of GAD, MDD, panic attacks, and insomnia.  Describes mood today as "ok". Pleasant. Mood symptoms - denies decreased depression, anxiety, and irritability. Denies panic attacks - "none of those anymore". Stating "I'm doing pretty good". Having more "family" time with hours decreased. Feels like current medications are working well. Has reduced use of "monster drinks". Family doing well. Stable interest and motivation. Taking medications as prescribed. Working with therapist. Energy levels stable. Active, does not have a regular exercise  routine. 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  Enjoys some usual interests and activities. Married. Lives with wife of 17 years and their 2 children 88 and 8.  Appetite adequate. Weight stable - 260-270 "maintaining". Sleep has improved - "overall better than it was". Averages 8 hours most nights. Focus and concentration stable. Completing tasks. Managing aspects of household. Working going well. Denies SI or HI. Denies AH or VH.  Medication trials: Melatonin, Ambien, Gabapentin, Seroquel, other  Review of Systems:  Review of Systems  Musculoskeletal: Negative for gait problem.  Neurological: Negative for tremors.  Psychiatric/Behavioral:       Please refer to HPI    Medications: I have reviewed the patient's current medications.  Current Outpatient Medications  Medication Sig Dispense Refill  . buPROPion (WELLBUTRIN XL) 300 MG 24 hr tablet Take 1 tablet (300 mg total) by mouth every morning. 90 tablet 1  . clonazePAM (KLONOPIN) 0.5 MG tablet TAKE ONE TABLET AT BEDTIME AS NEEDED FOR SLEEP 30 tablet 2  . hydrOXYzine (ATARAX/VISTARIL) 25 MG tablet Take one tablet four times daily as needed for sleep/anxiety. 360 tablet 1   No current facility-administered medications for this visit.    Medication Side Effects: None  Allergies:  Allergies  Allergen Reactions  . Penicillins Hives, Rash and Swelling    Past Medical History:  Diagnosis Date  . Anxiety   . Borderline high blood pressure   . Borderline hyperlipidemia   . Depression   . GERD (gastroesophageal reflux disease)     Family History  Problem Relation Age of Onset  . Cancer  Maternal Grandmother        colon  . COPD Other   . Hypertension Other   . Hyperlipidemia Other   . Obesity Other     Social History   Socioeconomic History  . Marital status: Married    Spouse name: Not on file  . Number of children: Not on file  . Years of education: Not on file  . Highest education level:  Not on file  Occupational History  . Not on file  Tobacco Use  . Smoking status: Never Smoker  . Smokeless tobacco: Former Systems developer    Types: Chew  Substance and Sexual Activity  . Alcohol use: Yes    Comment: about 1 daily  . Drug use: No  . Sexual activity: Not on file  Other Topics Concern  . Not on file  Social History Narrative  . Not on file   Social Determinants of Health   Financial Resource Strain:   . Difficulty of Paying Living Expenses: Not on file  Food Insecurity:   . Worried About Charity fundraiser in the Last Year: Not on file  . Ran Out of Food in the Last Year: Not on file  Transportation Needs:   . Lack of Transportation (Medical): Not on file  . Lack of Transportation (Non-Medical): Not on file  Physical Activity:   . Days of Exercise per Week: Not on file  . Minutes of Exercise per Session: Not on file  Stress:   . Feeling of Stress : Not on file  Social Connections:   . Frequency of Communication with Friends and Family: Not on file  . Frequency of Social Gatherings with Friends and Family: Not on file  . Attends Religious Services: Not on file  . Active Member of Clubs or Organizations: Not on file  . Attends Archivist Meetings: Not on file  . Marital Status: Not on file  Intimate Partner Violence:   . Fear of Current or Ex-Partner: Not on file  . Emotionally Abused: Not on file  . Physically Abused: Not on file  . Sexually Abused: Not on file    Past Medical History, Surgical history, Social history, and Family history were reviewed and updated as appropriate.   Please see review of systems for further details on the patient's review from today.   Objective:   Physical Exam:  There were no vitals taken for this visit.  Physical Exam Constitutional:      General: He is not in acute distress.    Appearance: He is well-developed.  Musculoskeletal:        General: No deformity.  Neurological:     Mental Status: He is alert and  oriented to person, place, and time.     Coordination: Coordination normal.  Psychiatric:        Attention and Perception: Attention and perception normal. He does not perceive auditory or visual hallucinations.        Mood and Affect: Mood normal. Mood is not anxious or depressed. Affect is not labile, blunt, angry or inappropriate.        Speech: Speech normal.        Behavior: Behavior normal.        Thought Content: Thought content normal. Thought content is not paranoid or delusional. Thought content does not include homicidal or suicidal ideation. Thought content does not include homicidal or suicidal plan.        Cognition and Memory: Cognition and memory normal.  Judgment: Judgment normal.     Comments: Insight intact     Lab Review:     Component Value Date/Time   NA 140 06/19/2013 0825   K 4.0 06/19/2013 0825   CL 100 06/19/2013 0825   CO2 26 06/19/2013 0825   GLUCOSE 93 06/19/2013 0825   BUN 14 06/19/2013 0825   CREATININE 1.08 06/26/2013 1405   CALCIUM 10.1 06/19/2013 0825   GFRNONAA 87 (L) 06/26/2013 1405   GFRAA >90 06/26/2013 1405       Component Value Date/Time   WBC 8.3 06/27/2013 0034   RBC 4.51 06/27/2013 0034   HGB 14.1 06/27/2013 0034   HCT 42.3 06/27/2013 0034   PLT 165 06/27/2013 0034   MCV 93.8 06/27/2013 0034   MCH 31.3 06/27/2013 0034   MCHC 33.3 06/27/2013 0034   RDW 12.6 06/27/2013 0034   LYMPHSABS 2.7 06/27/2013 0034   MONOABS 0.9 06/27/2013 0034   EOSABS 0.3 06/27/2013 0034   BASOSABS 0.0 06/27/2013 0034    No results found for: POCLITH, LITHIUM   No results found for: PHENYTOIN, PHENOBARB, VALPROATE, CBMZ   .res Assessment: Plan:   Plan:  1. Continue Wellbutrin XL 300mg  daily 2. Continue Coanzepam 0.5mg  at hs  3. Continue Hydroxyzine 25mg  3 capsule for anxiety/insomnia  Continue therapy  RTC 3 months  Patient advised to contact office with any questions, adverse effects, or acute worsening in signs and  symptoms.  Discussed potential benefits, risk, and side effects of benzodiazepines to include potential risk of tolerance and dependence, as well as possible drowsiness.  Advised patient not to drive if experiencing drowsiness and to take lowest possible effective dose to minimize risk of dependence and tolerance.  Roark was seen today for anxiety, depression, insomnia and panic attack.  Diagnoses and all orders for this visit:  Major depressive disorder, recurrent episode, moderate (HCC) -     buPROPion (WELLBUTRIN XL) 300 MG 24 hr tablet; Take 1 tablet (300 mg total) by mouth every morning.  Generalized anxiety disorder -     buPROPion (WELLBUTRIN XL) 300 MG 24 hr tablet; Take 1 tablet (300 mg total) by mouth every morning. -     hydrOXYzine (ATARAX/VISTARIL) 25 MG tablet; Take one tablet four times daily as needed for sleep/anxiety.  Insomnia, unspecified type -     hydrOXYzine (ATARAX/VISTARIL) 25 MG tablet; Take one tablet four times daily as needed for sleep/anxiety. -     clonazePAM (KLONOPIN) 0.5 MG tablet; TAKE ONE TABLET AT BEDTIME AS NEEDED FOR SLEEP  Panic attacks    Please see After Visit Summary for patient specific instructions.  No future appointments.  No orders of the defined types were placed in this encounter.     -------------------------------

## 2019-08-16 ENCOUNTER — Other Ambulatory Visit: Payer: Self-pay

## 2019-11-23 ENCOUNTER — Other Ambulatory Visit: Payer: Self-pay | Admitting: Internal Medicine

## 2019-11-23 DIAGNOSIS — R7989 Other specified abnormal findings of blood chemistry: Secondary | ICD-10-CM

## 2019-11-25 ENCOUNTER — Other Ambulatory Visit: Payer: Self-pay | Admitting: Adult Health

## 2019-11-25 DIAGNOSIS — F331 Major depressive disorder, recurrent, moderate: Secondary | ICD-10-CM

## 2019-11-25 DIAGNOSIS — F411 Generalized anxiety disorder: Secondary | ICD-10-CM

## 2019-12-02 ENCOUNTER — Other Ambulatory Visit: Payer: Self-pay | Admitting: Adult Health

## 2019-12-02 DIAGNOSIS — G4700 Insomnia, unspecified: Secondary | ICD-10-CM

## 2019-12-02 DIAGNOSIS — F411 Generalized anxiety disorder: Secondary | ICD-10-CM

## 2019-12-02 NOTE — Telephone Encounter (Signed)
Was due back in May

## 2020-02-29 ENCOUNTER — Other Ambulatory Visit: Payer: Self-pay | Admitting: Adult Health

## 2020-02-29 DIAGNOSIS — G4700 Insomnia, unspecified: Secondary | ICD-10-CM

## 2020-03-02 NOTE — Telephone Encounter (Signed)
Last apt 02/09 was due back 3 months

## 2020-05-06 ENCOUNTER — Ambulatory Visit (INDEPENDENT_AMBULATORY_CARE_PROVIDER_SITE_OTHER): Payer: 59 | Admitting: Adult Health

## 2020-05-06 ENCOUNTER — Encounter: Payer: Self-pay | Admitting: Adult Health

## 2020-05-06 ENCOUNTER — Other Ambulatory Visit: Payer: Self-pay

## 2020-05-06 ENCOUNTER — Telehealth: Payer: Self-pay | Admitting: Adult Health

## 2020-05-06 DIAGNOSIS — G4700 Insomnia, unspecified: Secondary | ICD-10-CM | POA: Diagnosis not present

## 2020-05-06 DIAGNOSIS — F411 Generalized anxiety disorder: Secondary | ICD-10-CM

## 2020-05-06 DIAGNOSIS — F5101 Primary insomnia: Secondary | ICD-10-CM | POA: Insufficient documentation

## 2020-05-06 DIAGNOSIS — F331 Major depressive disorder, recurrent, moderate: Secondary | ICD-10-CM | POA: Diagnosis not present

## 2020-05-06 NOTE — Progress Notes (Signed)
Phillip [name] [id number] [birth year] 45 y.o.  Subjective:   Patient ID:  Phillip [name] is a 45 y.o. (DOB [birth year]) male.  Chief Complaint: No chief complaint on file.   HPI Phillip [name] presents to the office today for follow-up of GAD, MDD, panic attacks, and insomnia.  Describes mood today as "not the best". Pleasant. Mood symptoms - reports depression, anxiety, and irritability. More anxious overall. Stating "I'm not doing as well as I was". Also stating "my anxiety is out of control". Symptoms worse in work setting. Heart pounding when going into work. Has a constant fear something bad is going to happen. Worrying about "any and everything". Family doing well. Increased stress in the work setting. Is able to get the "basics" done. Not meeting his own performannce goals. Decreased interest and motivation - feels "drained". Taking medications as prescribed. Working with therapist. Energy levels lower. Active, does not have a regular exercise routine.  Enjoys some usual interests and activities. Married. Lives with wife of 17 years and their 2 children 27 and 8. Family members local.  Appetite adequate - "hit or miss". Weight stable - 260-270 pounds "maintaining". Sleeps better some nights than others. Averages 6 hours most nights. Focus and concentration difficulties - "can't think and can't do". Completing tasks. Managing aspects of household. Working at [employer]. Denies SI or HI.  Denies AH or VH.  Medication trials: Melatonin, Ambien, Gabapentin, Seroquel, other  Review of Systems:  Review of Systems  Musculoskeletal: Negative for gait problem.  Neurological: Negative for tremors.  Psychiatric/Behavioral:       Please refer to HPI    Medications: I have reviewed the patient's current medications.  Current Outpatient Medications  Medication Sig Dispense Refill  . buPROPion (WELLBUTRIN XL) 300 MG 24 hr tablet TAKE 1 TABLET BY MOUTH EVERY DAY IN THE MORNING 90 tablet 1   . clonazePAM (KLONOPIN) 0.5 MG tablet TAKE ONE TABLET AT BEDTIME AS NEEDED FOR SLEEP 30 tablet 2  . hydrOXYzine (ATARAX/VISTARIL) 25 MG tablet Take one tablet four times daily as needed for sleep/anxiety. 360 tablet 1   No current facility-administered medications for this visit.    Medication Side Effects: None  Allergies:  Allergies  Allergen Reactions  . Penicillins Hives, Rash and Swelling    Past Medical History:  Diagnosis Date  . Anxiety   . Borderline high blood pressure   . Borderline hyperlipidemia   . Depression   . GERD (gastroesophageal reflux disease)     Family History  Problem Relation Age of Onset  . Cancer Maternal Grandmother        colon  . COPD Other   . Hypertension Other   . Hyperlipidemia Other   . Obesity Other     Social History   Socioeconomic History  . Marital status: Married    Spouse name: Not on file  . Number of children: Not on file  . Years of education: Not on file  . Highest education level: Not on file  Occupational History  . Not on file  Tobacco Use  . Smoking status: Never Smoker  . Smokeless tobacco: Former Neurosurgeon    Types: Chew  Substance and Sexual Activity  . Alcohol use: Yes    Comment: about 1 daily  . Drug use: No  . Sexual activity: Not on file  Other Topics Concern  . Not on file  Social History Narrative  . Not on file   Social Determinants of Health   Financial  Resource Strain: Not on file  Food Insecurity: Not on file  Transportation Needs: Not on file  Physical Activity: Not on file  Stress: Not on file  Social Connections: Not on file  Intimate Partner Violence: Not on file    Past Medical History, Surgical history, Social history, and Family history were reviewed and updated as appropriate.   Please see review of systems for further details on the patient's review from today.   Objective:   Physical Exam:  There were no vitals taken for this visit.  Physical Exam Constitutional:       General: He is not in acute distress. Musculoskeletal:        General: No deformity.  Neurological:     Mental Status: He is alert and oriented to person, place, and time.     Coordination: Coordination normal.  Psychiatric:        Attention and Perception: Attention and perception normal. He does not perceive auditory or visual hallucinations.        Mood and Affect: Mood normal. Mood is not anxious or depressed. Affect is not labile, blunt, angry or inappropriate.        Speech: Speech normal.        Behavior: Behavior normal.        Thought Content: Thought content normal. Thought content is not paranoid or delusional. Thought content does not include homicidal or suicidal ideation. Thought content does not include homicidal or suicidal plan.        Cognition and Memory: Cognition and memory normal.        Judgment: Judgment normal.     Comments: Insight intact     Lab Review:     Component Value Date/Time   NA 140 06/19/2013 0825   K 4.0 06/19/2013 0825   CL 100 06/19/2013 0825   CO2 26 06/19/2013 0825   GLUCOSE 93 06/19/2013 0825   BUN 14 06/19/2013 0825   CREATININE 1.08 06/26/2013 1405   CALCIUM 10.1 06/19/2013 0825   GFRNONAA 87 (L) 06/26/2013 1405   GFRAA >90 06/26/2013 1405       Component Value Date/Time   WBC 8.3 06/27/2013 0034   RBC 4.51 06/27/2013 0034   HGB 14.1 06/27/2013 0034   HCT 42.3 06/27/2013 0034   PLT 165 06/27/2013 0034   MCV 93.8 06/27/2013 0034   MCH 31.3 06/27/2013 0034   MCHC 33.3 06/27/2013 0034   RDW 12.6 06/27/2013 0034   LYMPHSABS 2.7 06/27/2013 0034   MONOABS 0.9 06/27/2013 0034   EOSABS 0.3 06/27/2013 0034   BASOSABS 0.0 06/27/2013 0034    No results found for: POCLITH, LITHIUM   No results found for: PHENYTOIN, PHENOBARB, VALPROATE, CBMZ   .res Assessment: Plan:    Plan:  1. Continue Wellbutrin XL 300mg  daily 2. Increase Clonazepam 0.5mg  daily to BID  Continue therapy -  Therapist taking him out of work  from 05/26/2020 through 06/20/2020. Agree with recommendation.  RTC 4 weeks  Patient advised to contact office with any questions, adverse effects, or acute worsening in signs and symptoms.  Discussed potential benefits, risk, and side effects of benzodiazepines to include potential risk of tolerance and dependence, as well as possible drowsiness.  Advised patient not to drive if experiencing drowsiness and to take lowest possible effective dose to minimize risk of dependence and tolerance.    Diagnoses and all orders for this visit:  Insomnia, unspecified type  Generalized anxiety disorder  Major depressive disorder, recurrent episode, moderate (  HCC)     Please see After Visit Summary for patient specific instructions.  No future appointments.  No orders of the defined types were placed in this encounter.   -------------------------------

## 2020-05-06 NOTE — Telephone Encounter (Signed)
Received fax from [employer] regarding Phillip [name] for completion of FMLA form. Placed on Traci's desk.

## 2020-05-06 NOTE — Telephone Encounter (Signed)
Noted ;will complete

## 2020-05-08 ENCOUNTER — Telehealth: Payer: Self-pay | Admitting: Adult Health

## 2020-05-08 NOTE — Telephone Encounter (Signed)
Received fax from [employer] regarding Phillip [name]. Need completion of Health Care Provider Certification.

## 2020-05-09 NOTE — Telephone Encounter (Signed)
See previous phone message. 

## 2020-05-09 NOTE — Telephone Encounter (Signed)
Called and left patient voicemail that his FMLA forms received would not be able to be completed until he is scheduled to discuss further with Almira Coaster in the office. Patient is scheduled 05/21/20 and if he has any concerns or questions before then to call back.

## 2020-05-14 ENCOUNTER — Telehealth: Payer: Self-pay | Admitting: Adult Health

## 2020-05-14 NOTE — Telephone Encounter (Signed)
Received fax from [employer] regarding Phillip [name]. They need a Health Care Provider Medical Certification to be completed. Placed on Traci's desk.

## 2020-05-14 NOTE — Telephone Encounter (Signed)
This is a duplicate form, will be completed after apt on 05/21/2020 when discussed.

## 2020-05-20 NOTE — Telephone Encounter (Signed)
I have paperwork for patient that I was waiting to complete but he canceled apt for 05/21/2020 and rescheduled for March. Unable to complete until has office visit to discuss with Almira Coaster.

## 2020-05-21 ENCOUNTER — Ambulatory Visit: Payer: 59 | Admitting: Adult Health

## 2020-05-21 NOTE — Telephone Encounter (Signed)
Noted  

## 2020-05-29 ENCOUNTER — Telehealth: Payer: Self-pay | Admitting: Adult Health

## 2020-05-29 NOTE — Telephone Encounter (Signed)
Received fax from [employer] regarding Phillip [name] for completion of FMLA form. Placed on Traci's desk.

## 2020-05-30 ENCOUNTER — Other Ambulatory Visit: Payer: Self-pay | Admitting: Adult Health

## 2020-05-30 DIAGNOSIS — F411 Generalized anxiety disorder: Secondary | ICD-10-CM

## 2020-05-30 DIAGNOSIS — F331 Major depressive disorder, recurrent, moderate: Secondary | ICD-10-CM

## 2020-05-30 NOTE — Telephone Encounter (Signed)
Noted  

## 2020-05-30 NOTE — Telephone Encounter (Signed)
[employer] faxed again requesting FMLA paperwork and records from 05/06/20. It was noted at that visit that his therapist had him out currently 05/26/20-06/20/20.   I'm still holding the paperwork until patient can get in for his apt with Almira Coaster to discuss what his request is from her, he now rescheduled from 05/21/2020 to 06/10/20.

## 2020-06-10 ENCOUNTER — Ambulatory Visit (INDEPENDENT_AMBULATORY_CARE_PROVIDER_SITE_OTHER): Payer: 59 | Admitting: Adult Health

## 2020-06-10 ENCOUNTER — Encounter: Payer: Self-pay | Admitting: Adult Health

## 2020-06-10 ENCOUNTER — Other Ambulatory Visit: Payer: Self-pay

## 2020-06-10 DIAGNOSIS — F410 Panic disorder [episodic paroxysmal anxiety] without agoraphobia: Secondary | ICD-10-CM | POA: Diagnosis not present

## 2020-06-10 DIAGNOSIS — F411 Generalized anxiety disorder: Secondary | ICD-10-CM | POA: Diagnosis not present

## 2020-06-10 DIAGNOSIS — F331 Major depressive disorder, recurrent, moderate: Secondary | ICD-10-CM | POA: Diagnosis not present

## 2020-06-10 DIAGNOSIS — G4700 Insomnia, unspecified: Secondary | ICD-10-CM

## 2020-06-10 NOTE — Progress Notes (Signed)
CLIFTON SAFLEY [id number] 1975/11/04 45 y.o.  Subjective:   Patient ID:  Phillip [name] is a 45 y.o. (DOB 16-May-1975) male.  Chief Complaint: No chief complaint on file.   HPI Phillip [name] presents to the office today for follow-up of GAD, MDD, panic attacks, and insomnia.  Describes mood today as "ok". Pleasant. Mood symptoms - reports decreased depression, anxiety, and irritability. Decreased worry and rumination. Stating "I am doing better". Has taken 5 weeks off of work due to stress. Stating "I have felt so much better". Has been at current job for 17 years. Working with therapist. Utilizing new techniques and strategies to help manage mood symptoms. Has decreased Clonazepam use to daily. Improved interest and motivation. Taking medications as prescribed. Working with therapist. Energy levels improved. Active, does not have a regular exercise routine.  Enjoys some usual interests and activities. Married. Lives with wife of 17 years and their 2 children 54 and 9. Family members local.  Appetite adequate. Weight stable - 260-270 pounds. Sleeps better some nights than others. Averages 6 hours most nights - "I have always been a poor sleeper". Focus and concentration improved. Completing tasks. Managing aspects of household. Working at [employer]. Denies SI or HI.  Denies AH or VH.  Medication trials: Melatonin, Ambien, Gabapentin, Seroquel, others  Review of Systems:  Review of Systems  Musculoskeletal: Negative for gait problem.  Neurological: Negative for tremors.  Psychiatric/Behavioral:       Please refer to HPI    Medications: I have reviewed the patient's current medications.  Current Outpatient Medications  Medication Sig Dispense Refill  . buPROPion (WELLBUTRIN XL) 300 MG 24 hr tablet TAKE 1 TABLET BY MOUTH EVERY DAY IN THE MORNING 90 tablet 1  . clonazePAM (KLONOPIN) 0.5 MG tablet TAKE ONE TABLET AT BEDTIME AS NEEDED FOR SLEEP 30 tablet 2   No current  facility-administered medications for this visit.    Medication Side Effects: None  Allergies:  Allergies  Allergen Reactions  . Penicillin G     Other reaction(s): swelling rash  . Penicillins Hives, Rash and Swelling    Past Medical History:  Diagnosis Date  . Anxiety   . Borderline high blood pressure   . Borderline hyperlipidemia   . Depression   . GERD (gastroesophageal reflux disease)     Family History  Problem Relation Age of Onset  . Cancer Maternal Grandmother        colon  . COPD Other   . Hypertension Other   . Hyperlipidemia Other   . Obesity Other     Social History   Socioeconomic History  . Marital status: Married    Spouse name: Not on file  . Number of children: Not on file  . Years of education: Not on file  . Highest education level: Not on file  Occupational History  . Not on file  Tobacco Use  . Smoking status: Never Smoker  . Smokeless tobacco: Former Neurosurgeon    Types: Chew  Substance and Sexual Activity  . Alcohol use: Yes    Comment: about 1 daily  . Drug use: No  . Sexual activity: Not on file  Other Topics Concern  . Not on file  Social History Narrative  . Not on file   Social Determinants of Health   Financial Resource Strain: Not on file  Food Insecurity: Not on file  Transportation Needs: Not on file  Physical Activity: Not on file  Stress: Not on file  Social  Connections: Not on file  Intimate Partner Violence: Not on file    Past Medical History, Surgical history, Social history, and Family history were reviewed and updated as appropriate.   Please see review of systems for further details on the patient's review from today.   Objective:   Physical Exam:  There were no vitals taken for this visit.  Physical Exam Constitutional:      General: He is not in acute distress. Musculoskeletal:        General: No deformity.  Neurological:     Mental Status: He is alert and oriented to person, place, and time.      Coordination: Coordination normal.  Psychiatric:        Attention and Perception: Attention and perception normal. He does not perceive auditory or visual hallucinations.        Mood and Affect: Mood normal. Mood is not anxious or depressed. Affect is not labile, blunt, angry or inappropriate.        Speech: Speech normal.        Behavior: Behavior normal.        Thought Content: Thought content normal. Thought content is not paranoid or delusional. Thought content does not include homicidal or suicidal ideation. Thought content does not include homicidal or suicidal plan.        Cognition and Memory: Cognition and memory normal.        Judgment: Judgment normal.     Comments: Insight intact     Lab Review:     Component Value Date/Time   NA 140 06/19/2013 0825   K 4.0 06/19/2013 0825   CL 100 06/19/2013 0825   CO2 26 06/19/2013 0825   GLUCOSE 93 06/19/2013 0825   BUN 14 06/19/2013 0825   CREATININE 1.08 06/26/2013 1405   CALCIUM 10.1 06/19/2013 0825   GFRNONAA 87 (L) 06/26/2013 1405   GFRAA >90 06/26/2013 1405       Component Value Date/Time   WBC 8.3 06/27/2013 0034   RBC 4.51 06/27/2013 0034   HGB 14.1 06/27/2013 0034   HCT 42.3 06/27/2013 0034   PLT 165 06/27/2013 0034   MCV 93.8 06/27/2013 0034   MCH 31.3 06/27/2013 0034   MCHC 33.3 06/27/2013 0034   RDW 12.6 06/27/2013 0034   LYMPHSABS 2.7 06/27/2013 0034   MONOABS 0.9 06/27/2013 0034   EOSABS 0.3 06/27/2013 0034   BASOSABS 0.0 06/27/2013 0034    No results found for: POCLITH, LITHIUM   No results found for: PHENYTOIN, PHENOBARB, VALPROATE, CBMZ   .res Assessment: Plan:    Plan:  1. Continue Wellbutrin XL 300mg  daily 2. Continue Clonazepam 0.5mg  daily to BID 3. Hydroxyzine 25mg  at bedtime - stopped due to itching  Continue therapy -  Out of work through 06/23/2020 through therapist.   RTC 3 months  Patient advised to contact office with any questions, adverse effects, or acute  worsening in signs and symptoms.  Discussed potential benefits, risk, and side effects of benzodiazepines to include potential risk of tolerance and dependence, as well as possible drowsiness.  Advised patient not to drive if experiencing drowsiness and to take lowest possible effective dose to minimize risk of dependence and tolerance.    Diagnoses and all orders for this visit:  Panic attacks  Major depressive disorder, recurrent episode, moderate (HCC)  Insomnia, unspecified type  Generalized anxiety disorder     Please see After Visit Summary for patient specific instructions.  No future appointments.  No orders of  the defined types were placed in this encounter.   -------------------------------

## 2020-06-11 NOTE — Telephone Encounter (Signed)
Patient had a visit on 0308/2022 with Almira Coaster, per her note patient is out of work by the therapist. I will contact [employer] with this information.

## 2020-09-10 ENCOUNTER — Ambulatory Visit: Payer: 59 | Admitting: Adult Health

## 2021-02-14 ENCOUNTER — Other Ambulatory Visit: Payer: Self-pay

## 2021-02-14 DIAGNOSIS — I8393 Asymptomatic varicose veins of bilateral lower extremities: Secondary | ICD-10-CM

## 2021-03-12 ENCOUNTER — Encounter (HOSPITAL_COMMUNITY): Payer: 59

## 2021-06-09 ENCOUNTER — Other Ambulatory Visit: Payer: Self-pay | Admitting: Otolaryngology

## 2021-06-11 ENCOUNTER — Other Ambulatory Visit: Payer: Self-pay

## 2021-06-11 ENCOUNTER — Encounter (HOSPITAL_COMMUNITY): Payer: Self-pay | Admitting: Otolaryngology

## 2021-06-11 NOTE — Progress Notes (Signed)
Phillip [name] denies chest pain or shortness of breath. Patient denies having any s/s of Covid in his household.  Patient denies any known exposure to Covid.  ? ?PCP is Dr. Anice Paganini. ? ?I instructed Phillip [name] to shower with antibacteria soap.  DO not shave. No nail polish, artificial or acrylic nails. Wear clean clothes, brush your teeth. ?Glasses, contact lens,dentures or partials may not be worn in the OR. If you need to wear them, please bring a case for glasses, do not wear contacts or bring a case, the hospital does not have contact cases, dentures or partials will have to be removed , make sure they are clean, we will provide a denture cup to put them in. You will need some one to drive you home and a responsible person over the age of 38 to stay with you for the first 24 hours after surgery.  ?

## 2021-06-12 ENCOUNTER — Ambulatory Visit (HOSPITAL_BASED_OUTPATIENT_CLINIC_OR_DEPARTMENT_OTHER): Payer: 59 | Admitting: Anesthesiology

## 2021-06-12 ENCOUNTER — Encounter (HOSPITAL_COMMUNITY): Payer: Self-pay | Admitting: Otolaryngology

## 2021-06-12 ENCOUNTER — Ambulatory Visit (HOSPITAL_COMMUNITY): Payer: 59 | Admitting: Anesthesiology

## 2021-06-12 ENCOUNTER — Encounter (HOSPITAL_COMMUNITY)
Admission: RE | Disposition: A | Discharge status: Home / Self Care | Payer: Self-pay | Source: Home / Self Care | Attending: Otolaryngology

## 2021-06-12 ENCOUNTER — Ambulatory Visit (HOSPITAL_COMMUNITY)
Admission: RE | Admit: 2021-06-12 | Discharge: 2021-06-12 | Disposition: A | Discharge status: Home / Self Care | Payer: 59 | Attending: Otolaryngology | Admitting: Otolaryngology

## 2021-06-12 ENCOUNTER — Other Ambulatory Visit: Payer: Self-pay

## 2021-06-12 DIAGNOSIS — J3501 Chronic tonsillitis: Secondary | ICD-10-CM

## 2021-06-12 HISTORY — DX: Varicose veins of bilateral lower extremities with other complications: I83.893

## 2021-06-12 HISTORY — DX: Personal history of other diseases of the digestive system: Z87.19

## 2021-06-12 HISTORY — PX: TONSILLECTOMY AND ADENOIDECTOMY: SHX28

## 2021-06-12 HISTORY — DX: Nonalcoholic steatohepatitis (NASH): K75.81

## 2021-06-12 LAB — COMPREHENSIVE METABOLIC PANEL
ALT: 100 U/L — ABNORMAL HIGH (ref 0–44)
AST: 59 U/L — ABNORMAL HIGH (ref 15–41)
Albumin: 4.1 g/dL (ref 3.5–5.0)
Alkaline Phosphatase: 40 U/L (ref 38–126)
Anion gap: 12 (ref 5–15)
BUN: 10 mg/dL (ref 6–20)
CO2: 19 mmol/L — ABNORMAL LOW (ref 22–32)
Calcium: 9.3 mg/dL (ref 8.9–10.3)
Chloride: 110 mmol/L (ref 98–111)
Creatinine, Ser: 0.89 mg/dL (ref 0.61–1.24)
GFR, Estimated: >60 mL/min (ref >60)
Glucose, Bld: 97 mg/dL (ref 70–99)
Potassium: 4.3 mmol/L (ref 3.5–5.1)
Sodium: 141 mmol/L (ref 135–145)
Total Bilirubin: 0.5 mg/dL (ref 0.3–1.2)
Total Protein: 6.5 g/dL (ref 6.5–8.1)

## 2021-06-12 SURGERY — TONSILLECTOMY AND ADENOIDECTOMY
Anesthesia: General | Site: Mouth | Laterality: Bilateral

## 2021-06-12 MED ORDER — HYDROCODONE-ACETAMINOPHEN 7.5-325 MG/15ML PO SOLN: ORAL | 0 refills | Status: DC

## 2021-06-12 MED ORDER — ONDANSETRON HCL 4 MG/2ML IJ SOLN: 4.0000 mg | Freq: Once | INTRAMUSCULAR | Status: DC | PRN

## 2021-06-12 MED ORDER — CLINDAMYCIN PHOSPHATE 600 MG/50ML IV SOLN
INTRAVENOUS | Status: DC | PRN
  Administered 2021-06-12: 600 mg via INTRAVENOUS

## 2021-06-12 MED ORDER — 0.9 % SODIUM CHLORIDE (POUR BTL) OPTIME
TOPICAL | Status: DC | PRN
  Administered 2021-06-12: 1000 mL

## 2021-06-12 MED ORDER — ROCURONIUM BROMIDE 10 MG/ML (PF) SYRINGE
PREFILLED_SYRINGE | INTRAVENOUS | Status: DC | PRN
  Administered 2021-06-12: 80 mg via INTRAVENOUS

## 2021-06-12 MED ORDER — CHLORHEXIDINE GLUCONATE 0.12 % MT SOLN
15.0000 mL | OROMUCOSAL | Status: AC
  Administered 2021-06-12: 15 mL via OROMUCOSAL
  Filled 2021-06-12: qty 15

## 2021-06-12 MED ORDER — DEXAMETHASONE SODIUM PHOSPHATE 10 MG/ML IJ SOLN
10.0000 mg | Freq: Once | INTRAMUSCULAR | Status: AC
  Administered 2021-06-12: 10 mg via INTRAVENOUS
  Filled 2021-06-12: qty 1

## 2021-06-12 MED ORDER — OXYMETAZOLINE HCL 0.05 % NA SOLN
NASAL | Status: AC
  Filled 2021-06-12: qty 30

## 2021-06-12 MED ORDER — MIDAZOLAM HCL 2 MG/2ML IJ SOLN
INTRAMUSCULAR | Status: DC | PRN
  Administered 2021-06-12: 2 mg via INTRAVENOUS

## 2021-06-12 MED ORDER — CLINDAMYCIN HCL 300 MG PO CAPS: 300.0000 mg | ORAL_CAPSULE | Freq: Three times a day (TID) | ORAL | 0 refills | Status: AC

## 2021-06-12 MED ORDER — OXYCODONE HCL 5 MG/5ML PO SOLN
ORAL | Status: AC
  Filled 2021-06-12: qty 5

## 2021-06-12 MED ORDER — LIDOCAINE 2% (20 MG/ML) 5 ML SYRINGE
INTRAMUSCULAR | Status: DC | PRN
  Administered 2021-06-12: 100 mg via INTRAVENOUS

## 2021-06-12 MED ORDER — LACTATED RINGERS IV SOLN: INTRAVENOUS | Status: DC

## 2021-06-12 MED ORDER — ONDANSETRON 4 MG PO TBDP: 4.0000 mg | ORAL_TABLET | Freq: Three times a day (TID) | ORAL | 0 refills | Status: DC | PRN

## 2021-06-12 MED ORDER — SUGAMMADEX SODIUM 200 MG/2ML IV SOLN
INTRAVENOUS | Status: DC | PRN
  Administered 2021-06-12: 400 mg via INTRAVENOUS

## 2021-06-12 MED ORDER — FENTANYL CITRATE (PF) 250 MCG/5ML IJ SOLN
INTRAMUSCULAR | Status: DC | PRN
  Administered 2021-06-12: 100 ug via INTRAVENOUS

## 2021-06-12 MED ORDER — HYDROMORPHONE HCL 1 MG/ML IJ SOLN
0.2500 mg | INTRAMUSCULAR | Status: DC | PRN
  Administered 2021-06-12: 0.25 mg via INTRAVENOUS

## 2021-06-12 MED ORDER — FENTANYL CITRATE (PF) 250 MCG/5ML IJ SOLN
INTRAMUSCULAR | Status: AC
  Filled 2021-06-12: qty 5

## 2021-06-12 MED ORDER — CLINDAMYCIN PHOSPHATE 600 MG/50ML IV SOLN
INTRAVENOUS | Status: AC
  Filled 2021-06-12: qty 50

## 2021-06-12 MED ORDER — DEXAMETHASONE SODIUM PHOSPHATE 10 MG/ML IJ SOLN
INTRAMUSCULAR | Status: DC | PRN
  Administered 2021-06-12: 10 mg via INTRAVENOUS

## 2021-06-12 MED ORDER — ONDANSETRON HCL 4 MG/2ML IJ SOLN
INTRAMUSCULAR | Status: DC | PRN
  Administered 2021-06-12: 4 mg via INTRAVENOUS

## 2021-06-12 MED ORDER — OXYCODONE HCL 5 MG/5ML PO SOLN: 5.0000 mg | Freq: Once | ORAL | Status: DC | PRN

## 2021-06-12 MED ORDER — MIDAZOLAM HCL 2 MG/2ML IJ SOLN
INTRAMUSCULAR | Status: AC
  Filled 2021-06-12: qty 2

## 2021-06-12 MED ORDER — DEXMEDETOMIDINE (PRECEDEX) IN NS 20 MCG/5ML (4 MCG/ML) IV SYRINGE
PREFILLED_SYRINGE | INTRAVENOUS | Status: DC | PRN
  Administered 2021-06-12: 12 ug via INTRAVENOUS
  Administered 2021-06-12: 8 ug via INTRAVENOUS

## 2021-06-12 MED ORDER — PROPOFOL 10 MG/ML IV BOLUS
INTRAVENOUS | Status: DC | PRN
  Administered 2021-06-12: 180 mg via INTRAVENOUS

## 2021-06-12 MED ORDER — OXYCODONE HCL 5 MG PO TABS: 5.0000 mg | ORAL_TABLET | Freq: Once | ORAL | Status: DC | PRN

## 2021-06-12 MED ORDER — ACETAMINOPHEN 10 MG/ML IV SOLN
INTRAVENOUS | Status: AC
  Filled 2021-06-12: qty 100

## 2021-06-12 MED ORDER — DEXAMETHASONE SODIUM PHOSPHATE 10 MG/ML IJ SOLN
INTRAMUSCULAR | Status: AC
  Filled 2021-06-12: qty 1

## 2021-06-12 MED ORDER — ACETAMINOPHEN 10 MG/ML IV SOLN
1000.0000 mg | Freq: Once | INTRAVENOUS | Status: DC | PRN
  Administered 2021-06-12: 1000 mg via INTRAVENOUS

## 2021-06-12 MED ORDER — HYDROMORPHONE HCL 1 MG/ML IJ SOLN
INTRAMUSCULAR | Status: AC
  Filled 2021-06-12: qty 1

## 2021-06-12 SURGICAL SUPPLY — 28 items
BAG COUNTER SPONGE SURGICOUNT (BAG) ×2 IMPLANT
BLADE SURG 15 STRL LF DISP TIS (BLADE) IMPLANT
BLADE SURG 15 STRL SS (BLADE)
CATH ROBINSON RED A/P 12FR (CATHETERS) IMPLANT
CLEANER TIP ELECTROSURG 2X2 (MISCELLANEOUS) ×2 IMPLANT
COAGULATOR SUCT SWTCH 10FR 6 (ELECTROSURGICAL) ×2 IMPLANT
DRAPE HALF SHEET 40X57 (DRAPES) IMPLANT
ELECT COATED BLADE 2.86 ST (ELECTRODE) ×2 IMPLANT
ELECT REM PT RETURN 9FT ADLT (ELECTROSURGICAL)
ELECT REM PT RETURN 9FT PED (ELECTROSURGICAL)
ELECTRODE REM PT RETRN 9FT PED (ELECTROSURGICAL) IMPLANT
ELECTRODE REM PT RTRN 9FT ADLT (ELECTROSURGICAL) IMPLANT
GLOVE SURG ENC TEXT LTX SZ7 (GLOVE) ×2 IMPLANT
GOWN STRL REUS W/ TWL LRG LVL3 (GOWN DISPOSABLE) ×2 IMPLANT
GOWN STRL REUS W/TWL LRG LVL3 (GOWN DISPOSABLE) ×4
KIT BASIN OR (CUSTOM PROCEDURE TRAY) ×2 IMPLANT
KIT TURNOVER KIT B (KITS) ×2 IMPLANT
NDL HYPO 25GX1X1/2 BEV (NEEDLE) IMPLANT
NEEDLE HYPO 25GX1X1/2 BEV (NEEDLE) IMPLANT
NS IRRIG 1000ML POUR BTL (IV SOLUTION) ×2 IMPLANT
PAD ARMBOARD 7.5X6 YLW CONV (MISCELLANEOUS) ×4 IMPLANT
PENCIL SMOKE EVACUATOR (MISCELLANEOUS) ×2 IMPLANT
SPECIMEN JAR SMALL (MISCELLANEOUS) ×4 IMPLANT
SPONGE TONSIL TAPE 1 RFD (DISPOSABLE) ×2 IMPLANT
TOWEL GREEN STERILE FF (TOWEL DISPOSABLE) ×4 IMPLANT
TRAY ENT MC OR (CUSTOM PROCEDURE TRAY) ×2 IMPLANT
TUBE SALEM SUMP 14F W/ARV (TUBING) ×2 IMPLANT
TUBE SALEM SUMP 16 FR W/ARV (TUBING) ×2 IMPLANT

## 2021-06-12 NOTE — Anesthesia Preprocedure Evaluation (Signed)
Anesthesia Evaluation  ?Patient identified by MRN, date of birth, ID band ? ?Reviewed: ?Allergy & Precautions, NPO status , Patient's Chart, lab work & pertinent test results ? ?Airway ?Mallampati: II ? ?TM Distance: >3 FB ?Neck ROM: Full ? ? ? Dental ?no notable dental hx. ?(+) Teeth Intact, Dental Advisory Given ?  ?Pulmonary ?neg pulmonary ROS,  ?  ?Pulmonary exam normal ?breath sounds clear to auscultation ? ? ? ? ? ? Cardiovascular ?hypertension, Normal cardiovascular exam ?Rhythm:Regular Rate:Normal ? ? ?  ?Neuro/Psych ?PSYCHIATRIC DISORDERS Anxiety Depression negative neurological ROS ?   ? GI/Hepatic ?hiatal hernia, GERD  ,  ?Endo/Other  ? ? Renal/GU ?  ? ?  ?Musculoskeletal ? ? Abdominal ?(+) + obese (BMI 37.3),   ?Peds ? Hematology ?  ?Anesthesia Other Findings ?All: PCN ? Reproductive/Obstetrics ? ?  ? ? ? ? ? ? ? ? ? ? ? ? ? ?  ?  ? ? ? ? ? ? ? ? ?Anesthesia Physical ?Anesthesia Plan ? ?ASA: 2 ? ?Anesthesia Plan: General  ? ?Post-op Pain Management: Dilaudid IV  ? ?Induction: Intravenous ? ?PONV Risk Score and Plan: 3 and Treatment may vary due to age or medical condition, Ondansetron and Midazolam ? ?Airway Management Planned: Oral ETT ? ?Additional Equipment: None ? ?Intra-op Plan:  ? ?Post-operative Plan: Extubation in OR ? ?Informed Consent: I have reviewed the patients History and Physical, chart, labs and discussed the procedure including the risks, benefits and alternatives for the proposed anesthesia with the patient or authorized representative who has indicated his/her understanding and acceptance.  ? ? ? ?Dental advisory given ? ?Plan Discussed with: CRNA ? ?Anesthesia Plan Comments:   ? ? ? ? ? ? ?Anesthesia Quick Evaluation ? ?

## 2021-06-12 NOTE — Anesthesia Postprocedure Evaluation (Signed)
Anesthesia Post Note ? ?Patient: Phillip [name] ? ?Procedure(s) Performed: TONSILLECTOMY (Bilateral: Mouth) ? ?  ? ?Patient location during evaluation: PACU ?Anesthesia Type: General ?Level of consciousness: awake and alert ?Pain management: pain level controlled ?Vital Signs Assessment: post-procedure vital signs reviewed and stable ?Respiratory status: spontaneous breathing, nonlabored ventilation, respiratory function stable and patient connected to nasal cannula oxygen ?Cardiovascular status: blood pressure returned to baseline and stable ?Postop Assessment: no apparent nausea or vomiting ?Anesthetic complications: no ? ? ?No notable events documented. ? ?Last Vitals:  ?Vitals:  ? 06/12/21 1123 06/12/21 1138  ?BP: 113/86 118/81  ?Pulse: 61 71  ?Resp: 16 (!) 21  ?Temp:    ?SpO2: 92% 92%  ?  ?Last Pain:  ?Vitals:  ? 06/12/21 1138  ?TempSrc:   ?PainSc: 3   ? ? ?  ?  ?  ?  ?  ?  ? ?Trevor Iha ? ? ? ? ?

## 2021-06-12 NOTE — Op Note (Signed)
Operative Note: Tonsillectomy ? ?Patient: Phillip [name] ? ?Medical record number: [id number] ? ?Date:06/12/2021 ? ?Pre-operative Indications: Chronic cryptic tonsillitis ? ?Postoperative Indications: Same ? ?Surgical Procedure: Tonsillectomy ? ?Anesthesia: GET ? ?Surgeon: Delsa Bern, M.D. ? ?Complications: None ? ?EBL: <100 cc ? ? ?Brief History: ?The patient is a 46 y.o. male with a history of recurrent acute tonsillitis and tonsillar hypertrophy. The patient has been on multiple courses of antibiotics for recurrent infection. Based on the patient's history and findings I recommended tonsillectomy under general anesthesia, risks and benefits were discussed in detail with the patient and family. They understand and agree with our plan for surgery which is scheduled on elective basis at [facility] in [place]. ? ?Surgical Procedure: ?The patient is brought to the operating room on 06/12/2021 and placed in supine position on the operating table. General endotracheal anesthesia was established without difficulty. When the patient was adequately anesthetized, surgical timeout was performed and correct identification of the patient and the surgical procedure. The patient was positioned and prepped and draped in sterile fashion. ? ?With the patient prepared for surgery a Phill Mutter mouth gag was inserted without difficulty, there were no loose or broken teeth and the hard soft palate were intact. Tonsillectomy was then performed, using Bovie cautery and dissecting in a subcapsular fashion the entire left tonsil was removed from superior pole to tongue base. Right tonsil removed in a similar fashion.  Tonsil tissue was sent to pathology for gross and microscopic evaluation.  The tonsillar fossa were gently abraded with dry tonsil sponge and several small areas of point hemorrhage were cauterized with suction cautery. The Crowe-Davis mouth gag was released and reapplied there is no active bleeding. Oral cavity and  nasopharynx were irrigated with saline. An orogastric tube was passed and stomach contents were aspirated. Mouthgag was removed, again no loose or broken teeth and no bleeding. Patient was awakened from anesthetic and extubated, then transferred from the operating room to the recovery room in stable condition. There were no complications and blood loss was less than 100 cc. ? ? ?Delsa Bern, M.D. ?[facility] ?06/12/2021  ?

## 2021-06-12 NOTE — Transfer of Care (Signed)
Immediate Anesthesia Transfer of Care Note ? ?Patient: Phillip [name] ? ?Procedure(s) Performed: TONSILLECTOMY (Bilateral: Mouth) ? ?Patient Location: PACU ? ?Anesthesia Type:General ? ?Level of Consciousness: awake, alert  and oriented ? ?Airway & Oxygen Therapy: Patient Spontanous Breathing and Patient connected to nasal cannula oxygen ? ?Post-op Assessment: Report given to RN and Post -op Vital signs reviewed and stable ? ?Post vital signs: Reviewed and stable ? ?Last Vitals:  ?Vitals Value Taken Time  ?BP 119/79 06/12/21 1008  ?Temp    ?Pulse 85 06/12/21 1009  ?Resp 16 06/12/21 1009  ?SpO2 94 % 06/12/21 1009  ?Vitals shown include unvalidated device data. ? ?Last Pain:  ?Vitals:  ? 06/12/21 0724  ?TempSrc:   ?PainSc: 0-No pain  ?   ? ?Patients Stated Pain Goal: 0 (06/12/21 0724) ? ?Complications: No notable events documented. ?

## 2021-06-12 NOTE — H&P (Signed)
Phillip [name] is an 46 y.o. male.   ?Chief Complaint: Chronic tonsil discharge ?HPI: History of chronic cryptic tonsillitis with discharge and low-grade sore throat. ? ?Past Medical History:  ?Diagnosis Date  ? Anxiety   ? Borderline high blood pressure   ? Borderline hyperlipidemia   ? Depression   ? GERD (gastroesophageal reflux disease)   ? History of hiatal hernia   ? NASH (nonalcoholic steatohepatitis)   ? Varicose veins of both legs with edema   ? history of  ? ? ?Past Surgical History:  ?Procedure Laterality Date  ? BREATH TEK H PYLORI N/A 12/19/2012  ? Procedure: BREATH TEK H PYLORI;  Surgeon: [name], MD;  Location: [facility];  Service: General;  Laterality: N/A;  Pt is scheduled at 730  ? LAPAROSCOPIC GASTRIC BANDING WITH HIATAL HERNIA REPAIR  06/26/2013  ? Procedure: LAPAROSCOPIC GASTRIC BANDING WITH HIATAL HERNIA REPAIR;  Surgeon: [name], MD;  Location: [facility];  Service: General;;  ? UMBILICAL HERNIA REPAIR  06/26/2013  ? Procedure: HERNIA REPAIR UMBILICAL ADULT;  Surgeon: [name], MD;  Location: [facility];  Service: General;;  ? ? ?Family History  ?Problem Relation Age of Onset  ? Cancer Maternal Grandmother   ?     colon  ? COPD Other   ? Hypertension Other   ? Hyperlipidemia Other   ? Obesity Other   ? ?Social History:  reports that he has never smoked. He quit smokeless tobacco use about 11 years ago.  His smokeless tobacco use included chew. He reports current alcohol use of about 6.0 standard drinks per week. He reports that he does not use drugs. ? ?Allergies:  ?Allergies  ?Allergen Reactions  ? Penicillin G   ?  Other reaction(s): swelling rash  ? Penicillins Hives, Rash and Swelling  ? ? ?Medications Prior to Admission  ?Medication Sig Dispense Refill  ? clonazePAM (KLONOPIN) 0.5 MG tablet TAKE ONE TABLET AT BEDTIME AS NEEDED FOR SLEEP (Patient taking differently: Take 0.5 mg by mouth at bedtime.) 30 tablet 2  ? fluvoxaMINE (LUVOX) 50 MG tablet Take 50 mg by mouth  at bedtime.    ? buPROPion (WELLBUTRIN XL) 300 MG 24 hr tablet TAKE 1 TABLET BY MOUTH EVERY DAY IN THE MORNING (Patient not taking: Reported on 06/08/2021) 90 tablet 1  ? ? ?Results for orders placed or performed during the hospital encounter of 06/12/21 (from the past 48 hour(s))  ?Comprehensive Metabolic Panel     Status: Abnormal  ? Collection Time: 06/12/21  7:50 AM  ?Result Value Ref Range  ? Sodium 141 135 - 145 mmol/L  ? Potassium 4.3 3.5 - 5.1 mmol/L  ? Chloride 110 98 - 111 mmol/L  ? CO2 19 (L) 22 - 32 mmol/L  ? Glucose, Bld 97 70 - 99 mg/dL  ?  Comment: Glucose reference range applies only to samples taken after fasting for at least 8 hours.  ? BUN 10 6 - 20 mg/dL  ? Creatinine, Ser 0.89 0.61 - 1.24 mg/dL  ? Calcium 9.3 8.9 - 10.3 mg/dL  ? Total Protein 6.5 6.5 - 8.1 g/dL  ? Albumin 4.1 3.5 - 5.0 g/dL  ? AST 59 (H) 15 - 41 U/L  ? ALT 100 (H) 0 - 44 U/L  ? Alkaline Phosphatase 40 38 - 126 U/L  ? Total Bilirubin 0.5 0.3 - 1.2 mg/dL  ? GFR, Estimated >60 >60 mL/min  ?  Comment: (NOTE) ?Calculated using the CKD-EPI Creatinine Equation (  2021) ?  ? Anion gap 12 5 - 15  ?  Comment: Performed at [facility], [address] [place], [place] [address]  ? ?No results found. ? ?Review of Systems  ?HENT:  Positive for sore throat.   ?Respiratory: Negative.    ? ?Blood pressure (!) 116/91, pulse 81, temperature 97.9 ?F (36.6 ?C), temperature source Oral, resp. rate 17, height 6' (1.829 m), weight 124.7 kg, SpO2 94 %. ?Physical Exam ?Constitutional:   ?   Appearance: He is normal weight.  ?HENT:  ?   Mouth/Throat:  ?   Comments: Chronic cryptic tonsillar changes ?Cardiovascular:  ?   Rate and Rhythm: Normal rate.  ?   Pulses: Normal pulses.  ?Pulmonary:  ?   Effort: Pulmonary effort is normal.  ?Neurological:  ?   Mental Status: He is alert.  ?  ? ?Assessment/Plan ?Patient admitted for outpatient surgery under general anesthesia: Tonsillectomy and possible adenoidectomy. ? ?Osborn Coho, MD ?06/12/2021, 8:52  AM ? ? ? ?

## 2021-06-12 NOTE — Anesthesia Procedure Notes (Signed)
Procedure Name: Intubation ?Date/Time: 06/12/2021 9:22 AM ?Performed by: Leonor Liv, CRNA ?Pre-anesthesia Checklist: Patient identified, Emergency Drugs available, Suction available and Patient being monitored ?Patient Re-evaluated:Patient Re-evaluated prior to induction ?Oxygen Delivery Method: Circle System Utilized ?Preoxygenation: Pre-oxygenation with 100% oxygen ?Induction Type: IV induction ?Ventilation: Mask ventilation without difficulty ?Laryngoscope Size: Mac and 4 ?Grade View: Grade I ?Tube type: Oral Dwyane Luo ?Tube size: 7.5 mm ?Number of attempts: 1 ?Placement Confirmation: ETT inserted through vocal cords under direct vision, positive ETCO2 and breath sounds checked- equal and bilateral ?Secured at: 24 cm ?Tube secured with: Tape ?Dental Injury: Teeth and Oropharynx as per pre-operative assessment  ? ? ? ? ?

## 2021-06-13 ENCOUNTER — Encounter (HOSPITAL_COMMUNITY): Payer: Self-pay | Admitting: Otolaryngology

## 2021-06-15 LAB — SURGICAL PATHOLOGY

## 2021-08-25 ENCOUNTER — Other Ambulatory Visit: Payer: Self-pay | Admitting: Internal Medicine

## 2021-08-26 ENCOUNTER — Other Ambulatory Visit: Payer: Self-pay | Admitting: Internal Medicine

## 2021-08-26 DIAGNOSIS — R4701 Aphasia: Secondary | ICD-10-CM

## 2021-08-26 DIAGNOSIS — R410 Disorientation, unspecified: Secondary | ICD-10-CM

## 2021-09-21 ENCOUNTER — Ambulatory Visit
Admission: RE | Admit: 2021-09-21 | Discharge: 2021-09-21 | Disposition: A | Discharge status: Home / Self Care | Payer: 59 | Source: Ambulatory Visit | Attending: Internal Medicine | Admitting: Internal Medicine

## 2021-09-21 DIAGNOSIS — R410 Disorientation, unspecified: Secondary | ICD-10-CM

## 2021-09-21 DIAGNOSIS — R4701 Aphasia: Secondary | ICD-10-CM

## 2021-11-02 ENCOUNTER — Other Ambulatory Visit: Payer: Self-pay | Admitting: *Deleted

## 2021-11-02 DIAGNOSIS — I8393 Asymptomatic varicose veins of bilateral lower extremities: Secondary | ICD-10-CM | POA: Insufficient documentation

## 2021-11-02 DIAGNOSIS — Z86718 Personal history of other venous thrombosis and embolism: Secondary | ICD-10-CM

## 2021-11-02 NOTE — Addendum Note (Signed)
Addended by: Thomasena Edis on: 11/02/2021 02:45 PM   Modules accepted: Orders

## 2021-11-03 DIAGNOSIS — M10072 Idiopathic gout, left ankle and foot: Secondary | ICD-10-CM | POA: Diagnosis not present

## 2021-11-30 ENCOUNTER — Ambulatory Visit: Payer: BC Managed Care – PPO | Admitting: Surgery

## 2021-11-30 ENCOUNTER — Encounter: Payer: Self-pay | Admitting: Surgery

## 2021-11-30 ENCOUNTER — Ambulatory Visit (HOSPITAL_COMMUNITY)
Admission: RE | Admit: 2021-11-30 | Discharge: 2021-11-30 | Disposition: A | Discharge status: Home / Self Care | Payer: BC Managed Care – PPO | Source: Ambulatory Visit | Attending: Vascular Surgery | Admitting: Vascular Surgery

## 2021-11-30 VITALS — BP 137/87 | HR 65 | Temp 97.9°F | Resp 20 | Ht 72.0 in | Wt 275.8 lb

## 2021-11-30 DIAGNOSIS — I872 Venous insufficiency (chronic) (peripheral): Secondary | ICD-10-CM | POA: Diagnosis not present

## 2021-11-30 DIAGNOSIS — Z86718 Personal history of other venous thrombosis and embolism: Secondary | ICD-10-CM | POA: Diagnosis not present

## 2021-11-30 DIAGNOSIS — H9192 Unspecified hearing loss, left ear: Secondary | ICD-10-CM | POA: Diagnosis not present

## 2021-11-30 DIAGNOSIS — H7292 Unspecified perforation of tympanic membrane, left ear: Secondary | ICD-10-CM | POA: Diagnosis not present

## 2021-11-30 NOTE — Progress Notes (Signed)
[facility] Specialist [facility]  Patient name: Phillip [name] MRN: [id number] DOB: [birth year] Sex: male   REQUESTING PROVIDER:    Dr. Ashby Dawes   REASON FOR CONSULT:    Varicose veins  HISTORY OF PRESENT ILLNESS:   Phillip [name] is a 46 y.o. male, who is referred for evaluation of management of venous disorders to both legs.  The patient has a history of bilateral laser ablation to his great saphenous veins done at [place] [facility] many years ago.  He is also undergone injections in has had a good result.  He has had recurrence of some veins around his right calf.  Earlier this year, he had to undergo a tonsillectomy and later around after surgery for approximately a week.  Because he had met his deductible for his insurance, he went back to [place] [facility] to evaluate some bulging veins he had a his calf.  During ultrasound evaluation a DVT was identified and he was started on anticoagulation.  He has since changed insurance companies and cannot be seen at [place] [facility] anymore and so he is referred to the [facility].  He does state that his mother has issues with vein problems and clotting.  The patient is on low-dose Xarelto currently.  He denies any leg swelling.  He does have hypertension as well as hyperlipidemia and nonalcoholic steatohepatitis.  PAST MEDICAL HISTORY    Past Medical History:  Diagnosis Date   Anxiety    Borderline high blood pressure    Borderline hyperlipidemia    Depression    GERD (gastroesophageal reflux disease)    History of hiatal hernia    NASH (nonalcoholic steatohepatitis)    Varicose veins of both legs with edema    history of     FAMILY HISTORY   Family History  Problem Relation Age of Onset   Cancer Maternal Grandmother        colon   COPD Other    Hypertension Other    Hyperlipidemia Other    Obesity Other     SOCIAL HISTORY:   Social History   Socioeconomic History   Marital status:  Married    Spouse name: Not on file   Number of children: Not on file   Years of education: Not on file   Highest education level: Not on file  Occupational History   Not on file  Tobacco Use   Smoking status: Never   Smokeless tobacco: Former    Types: Chew    Quit date: 04/05/2010  Substance and Sexual Activity   Alcohol use: Yes    Alcohol/week: 6.0 standard drinks of alcohol    Types: 2 Glasses of wine, 2 Cans of beer, 2 Shots of liquor per week   Drug use: No   Sexual activity: Not on file  Other Topics Concern   Not on file  Social History Narrative   Not on file   Social Determinants of Health   Financial Resource Strain: Not on file  Food Insecurity: Not on file  Transportation Needs: Not on file  Physical Activity: Not on file  Stress: Not on file  Social Connections: Not on file  Intimate Partner Violence: Not on file    ALLERGIES:    Allergies  Allergen Reactions   Penicillin G     Other reaction(s): swelling rash   Penicillins Hives, Rash and Swelling    CURRENT MEDICATIONS:    Current Outpatient Medications  Medication Sig Dispense Refill   clonazePAM (KLONOPIN)  0.5 MG tablet TAKE ONE TABLET AT BEDTIME AS NEEDED FOR SLEEP (Patient taking differently: Take 0.5 mg by mouth at bedtime.) 30 tablet 2   fluvoxaMINE (LUVOX) 50 MG tablet Take 50 mg by mouth at bedtime.     rivaroxaban (XARELTO) 2.5 MG TABS tablet 1 tablet     No current facility-administered medications for this visit.    REVIEW OF SYSTEMS:   [X]  denotes positive finding, [ ]  denotes negative finding Cardiac  Comments:  Chest pain or chest pressure:    Shortness of breath upon exertion:    Short of breath when lying flat:    Irregular heart rhythm:        Vascular    Pain in calf, thigh, or hip brought on by ambulation:    Pain in feet at night that wakes you up from your sleep:     Blood clot in your veins:    Leg swelling:         Pulmonary    Oxygen at home:    Productive  cough:     Wheezing:         Neurologic    Sudden weakness in arms or legs:     Sudden numbness in arms or legs:     Sudden onset of difficulty speaking or slurred speech:    Temporary loss of vision in one eye:     Problems with dizziness:         Gastrointestinal    Blood in stool:      Vomited blood:         Genitourinary    Burning when urinating:     Blood in urine:        Psychiatric    Major depression:         Hematologic    Bleeding problems:    Problems with blood clotting too easily:        Skin    Rashes or ulcers:        Constitutional    Fever or chills:     PHYSICAL EXAM:   Vitals:   11/30/21 1451  BP: 137/87  Pulse: 65  Resp: 20  Temp: 97.9 F (36.6 C)  SpO2: 97%  Weight: 275 lb 12.8 oz (125.1 kg)  Height: 6' (1.829 m)    GENERAL: The patient is a well-nourished male, in no acute distress. The vital signs are documented above. CARDIAC: There is a regular rate and rhythm.  VASCULAR: Moderate-sized varix on the right medial calf, no significant edema PULMONARY: Nonlabored respirations MUSCULOSKELETAL: There are no major deformities or cyanosis. NEUROLOGIC: No focal weakness or paresthesias are detected. SKIN: There are no ulcers or rashes noted. PSYCHIATRIC: The patient has a normal affect.  STUDIES:   I have reviewed the following reflux study:   ASSESSMENT and PLAN   Bilateral lower extremity venous insufficiency: The patient is undergone bilateral saphenous ablation at [place] [facility] as well as injections to his varicosities.  He has had near resolution of everything however has had some areas pop out on the right calf.  These are nontender.  They do not bother him.  I have encouraged him to continue to monitor these.  If he needs to have them addressed, he could undergo stab phlebectomy  DVT: I do not have a copy of the ultrasound that shows this, however the patient has been on treatment.  He has been decreased to low-dose Xarelto.   This sounds like a provoked DVT as  it happened right after surgery, however he describes what sounds like a family history of venous disorders in his mother and so I think that he would benefit from a hematology evaluation before stopping anticoagulation.  I am in the process of arranging this.   Leia Alf, MD, FACS [facility] [phone] Pager [phone]

## 2021-12-09 ENCOUNTER — Telehealth: Payer: Self-pay | Admitting: Hematology and Oncology

## 2021-12-09 NOTE — Telephone Encounter (Signed)
Scheduled appt per 8/30 referral. Pt is aware of appt date and time. Pt is aware to arrive 15 mins prior to appt time and to bring and updated insurance card. Pt is aware of appt location.   

## 2021-12-31 ENCOUNTER — Inpatient Hospital Stay: Payer: BC Managed Care – PPO

## 2021-12-31 ENCOUNTER — Inpatient Hospital Stay: Payer: BC Managed Care – PPO | Attending: Hematology and Oncology | Admitting: Hematology and Oncology

## 2021-12-31 VITALS — BP 142/87 | HR 85 | Temp 97.5°F | Resp 18 | Ht 72.0 in | Wt 275.7 lb

## 2021-12-31 DIAGNOSIS — I82402 Acute embolism and thrombosis of unspecified deep veins of left lower extremity: Secondary | ICD-10-CM | POA: Diagnosis not present

## 2021-12-31 DIAGNOSIS — I824Z2 Acute embolism and thrombosis of unspecified deep veins of left distal lower extremity: Secondary | ICD-10-CM

## 2021-12-31 DIAGNOSIS — E663 Overweight: Secondary | ICD-10-CM

## 2021-12-31 DIAGNOSIS — Z7901 Long term (current) use of anticoagulants: Secondary | ICD-10-CM

## 2021-12-31 DIAGNOSIS — Z87891 Personal history of nicotine dependence: Secondary | ICD-10-CM | POA: Insufficient documentation

## 2021-12-31 DIAGNOSIS — I824Y2 Acute embolism and thrombosis of unspecified deep veins of left proximal lower extremity: Secondary | ICD-10-CM

## 2021-12-31 DIAGNOSIS — R760 Raised antibody titer: Secondary | ICD-10-CM | POA: Insufficient documentation

## 2021-12-31 DIAGNOSIS — M109 Gout, unspecified: Secondary | ICD-10-CM

## 2021-12-31 DIAGNOSIS — Z800 Family history of malignant neoplasm of digestive organs: Secondary | ICD-10-CM | POA: Diagnosis not present

## 2021-12-31 LAB — URIC ACID: Uric Acid, Serum: 8.2 mg/dL (ref 3.7–8.6)

## 2021-12-31 NOTE — Progress Notes (Signed)
[facility] CONSULT NOTE  Patient Care Team: Georgianne Fick, MD as PCP - General (Internal Medicine)  CHIEF COMPLAINTS/PURPOSE OF CONSULTATION:  Left leg DVT  HISTORY OF PRESENTING ILLNESS:  Phillip [name] 46 y.o. male is here because of recent diagnosis of left leg DVT in May 2023.  He went for a routine visit to the vein specialist at clinic because he has a prior history of varicose veins and had previously undergone venous stripping.  He underwent ultrasound and was told that he has a lower extremity DVT in his left leg.  He was started on Xarelto.  Subsequently his insurance changed and therefore he could not go back to the same office.  In end of August he underwent ultrasound of the legs which did not show any further evidence of DVT.  He was referred to [place] because of this and and to discuss duration of anticoagulation.  His mother has had multiple hematological issues and is under the care of Dr. Myna Hidalgo.  I reviewed her records extensively and collaborated the history with the patient.  SUMMARY OF ONCOLOGIC HISTORY: Oncology History   No history exists.     MEDICAL HISTORY:  Past Medical History:  Diagnosis Date   Anxiety    Borderline high blood pressure    Borderline hyperlipidemia    Depression    GERD (gastroesophageal reflux disease)    History of hiatal hernia    NASH (nonalcoholic steatohepatitis)    Varicose veins of both legs with edema    history of    SURGICAL HISTORY: Past Surgical History:  Procedure Laterality Date   BREATH TEK H PYLORI N/A 12/19/2012   Procedure: BREATH TEK H PYLORI;  Surgeon: [name], MD;  Location: [facility];  Service: General;  Laterality: N/A;  Pt is scheduled at 730   LAPAROSCOPIC GASTRIC BANDING WITH HIATAL HERNIA REPAIR  06/26/2013   Procedure: LAPAROSCOPIC GASTRIC BANDING WITH HIATAL HERNIA REPAIR;  Surgeon: [name], MD;  Location: [facility];  Service: General;;   TONSILLECTOMY AND  ADENOIDECTOMY Bilateral 06/12/2021   Procedure: TONSILLECTOMY;  Surgeon: Osborn Coho, MD;  [facility];  Service: ENT;  Laterality: Bilateral;   UMBILICAL HERNIA REPAIR  06/26/2013   Procedure: HERNIA REPAIR UMBILICAL ADULT;  Surgeon: [name], MD;  Location: [facility];  Service: General;;    SOCIAL HISTORY: Social History   Socioeconomic History   Marital status: Married    Spouse name: Not on file   Number of children: Not on file   Years of education: Not on file   Highest education level: Not on file  Occupational History   Not on file  Tobacco Use   Smoking status: Never   Smokeless tobacco: Former    Types: Chew    Quit date: 04/05/2010  Substance and Sexual Activity   Alcohol use: Yes    Alcohol/week: 6.0 standard drinks of alcohol    Types: 2 Glasses of wine, 2 Cans of beer, 2 Shots of liquor per week   Drug use: No   Sexual activity: Not on file  Other Topics Concern   Not on file  Social History Narrative   Not on file   Social Determinants of Health   Financial Resource Strain: Not on file  Food Insecurity: Not on file  Transportation Needs: Not on file  Physical Activity: Not on file  Stress: Not on file  Social Connections: Not on file  Intimate Partner Violence: Not on file  FAMILY HISTORY: Family History  Problem Relation Age of Onset   Cancer Maternal Grandmother        colon   COPD Other    Hypertension Other    Hyperlipidemia Other    Obesity Other     ALLERGIES:  is allergic to penicillin g and penicillins.  MEDICATIONS:  Current Outpatient Medications  Medication Sig Dispense Refill   clonazePAM (KLONOPIN) 0.5 MG tablet TAKE ONE TABLET AT BEDTIME AS NEEDED FOR SLEEP (Patient taking differently: Take 0.5 mg by mouth at bedtime.) 30 tablet 2   fluvoxaMINE (LUVOX) 50 MG tablet Take 50 mg by mouth at bedtime.     rivaroxaban (XARELTO) 2.5 MG TABS tablet 1 tablet     No current facility-administered medications for this  visit.    REVIEW OF SYSTEMS:   Constitutional: Denies fevers, chills or abnormal night sweats   All other systems were reviewed with the patient and are negative.  PHYSICAL EXAMINATION: ECOG PERFORMANCE STATUS: 1 - Symptomatic but completely ambulatory  Vitals:   12/31/21 1546  BP: (!) 142/87  Pulse: 85  Resp: 18  Temp: (!) 97.5 F (36.4 C)  SpO2: 98%   Filed Weights   12/31/21 1546  Weight: 275 lb 11.2 oz (125.1 kg)    GENERAL:alert, no distress and comfortable    LABORATORY DATA:  I have reviewed the data as listed Lab Results  Component Value Date   WBC 8.3 06/27/2013   HGB 14.1 06/27/2013   HCT 42.3 06/27/2013   MCV 93.8 06/27/2013   PLT 165 06/27/2013   Lab Results  Component Value Date   NA 141 06/12/2021   K 4.3 06/12/2021   CL 110 06/12/2021   CO2 19 (L) 06/12/2021    RADIOGRAPHIC STUDIES: I have personally reviewed the radiological reports and agreed with the findings in the report.  ASSESSMENT AND PLAN:  Left leg DVT [facility]) May 2023: Patient went to vein clinic and was told that he had developed a DVT in his left lower extremity.  (Prior history of venous stripping for varicose veins)  Current treatment: Xarelto.  Repeat ultrasound of leg 11/30/2021: No evidence of DVT  I discussed with the patient risk factors for blood clots.  Inherited risk factors include: 1. Factor V Leiden mutation 2. Prothrombin gene G20210A 3. Protein S deficiency  4. Protein C deficiency  5. Antithrombin deficiency  Acquired risk factors include: 1. Antiphospholipid antibody syndrome (lupus anticoagulant testing will be ordered but patient understands that there could be false positive readings on this) 2. Tobacco use: He discontinued tobacco 6 months ago 3.  Overweight 4.  Sedentary behavior   Workup recommended: Bloodwork to evaluate for the 5 inherited factors mentioned above along with antiphospholipid antibodies.  Telephone visit in 1 week to discuss the  results of the tests and that we will determine the duration of anticoagulation.   All questions were answered. The patient knows to call the clinic with any problems, questions or concerns.    Harriette Ohara, MD 12/31/21

## 2021-12-31 NOTE — Assessment & Plan Note (Signed)
May 2023: Patient went to vein clinic and was told that he had developed a DVT in his left lower extremity.  (Prior history of venous stripping for varicose veins)  Current treatment: Xarelto.  Repeat ultrasound of leg 11/30/2021: No evidence of DVT  I discussed with the patient risk factors for blood clots.  Inherited risk factors include: 1. Factor V Leiden mutation 2. Prothrombin gene G20210A 3. Protein S deficiency  4. Protein C deficiency  5. Antithrombin deficiency  Acquired risk factors include: 1. Antiphospholipid antibody syndrome (lupus anticoagulant testing will be ordered but patient understands that there could be false positive readings on this) 2. Tobacco use: He discontinued tobacco 6 months ago 3.  Overweight 4.  Sedentary behavior   Workup recommended: Bloodwork to evaluate for the 5 inherited factors mentioned above along with antiphospholipid antibodies.  Telephone visit in 1 week to discuss the results of the tests and that we will determine the duration of anticoagulation.

## 2022-01-01 ENCOUNTER — Telehealth: Payer: Self-pay | Admitting: Hematology and Oncology

## 2022-01-01 LAB — PROTEIN S, TOTAL: Protein S Ag, Total: 117 % (ref 60–150)

## 2022-01-01 LAB — ANTITHROMBIN III ANTIGEN: AT III AG PPP IMM-ACNC: 108 % (ref 72–124)

## 2022-01-01 LAB — PROTEIN C, TOTAL: Protein C, Total: 124 % (ref 60–150)

## 2022-01-01 NOTE — Telephone Encounter (Signed)
Scheduled appointment per 9/28 los. Left voicemail. 

## 2022-01-02 LAB — LUPUS ANTICOAGULANT PANEL
DRVVT: 76.4 s — ABNORMAL HIGH (ref 0.0–47.0)
PTT Lupus Anticoagulant: 44.8 s — ABNORMAL HIGH (ref 0.0–43.5)

## 2022-01-02 LAB — DRVVT MIX: dRVVT Mix: 52.9 s — ABNORMAL HIGH (ref 0.0–40.4)

## 2022-01-02 LAB — PTT-LA MIX: PTT-LA Mix: 39.4 s (ref 0.0–40.5)

## 2022-01-02 LAB — PTT-LA INCUB MIX: PTT-LA Incub Mix: 43.5 s — ABNORMAL HIGH (ref 0.0–40.5)

## 2022-01-02 LAB — DRVVT CONFIRM: dRVVT Confirm: 1.7 ratio — ABNORMAL HIGH (ref 0.8–1.2)

## 2022-01-02 LAB — HEXAGONAL PHASE PHOSPHOLIPID: Hexagonal Phase Phospholipid: 5 s (ref 0–11)

## 2022-01-05 LAB — PROTHROMBIN GENE MUTATION

## 2022-01-06 LAB — FACTOR 5 LEIDEN

## 2022-01-06 NOTE — Progress Notes (Signed)
HEMATOLOGY-ONCOLOGY TELEPHONE VISIT PROGRESS NOTE  I connected with our patient on 01/11/22 at 11:15 AM EDT by telephone and verified that I am speaking with the correct person using two identifiers.  I discussed the limitations, risks, security and privacy concerns of performing an evaluation and management service by telephone and the availability of in person appointments.  I also discussed with the patient that there may be a patient responsible charge related to this service. The patient expressed understanding and agreed to proceed.   History of Present Illness: Phillip [name] 46 y.o. male is here because of recent diagnosis of left leg DVT in May 2023.  He went for a routine visit to the vein specialist at clinic because he has a prior history of varicose veins and had previously undergone venous stripping.  He underwent ultrasound and was told that he has a lower extremity DVT in his left leg. He presents to the clinic today via phone.  He underwent extensive blood work for hypercoagulability and is connecting via telephone to discuss the results of the blood work.    REVIEW OF SYSTEMS:   Constitutional: Denies fevers, chills or abnormal weight loss All other systems were reviewed with the patient and are negative. Observations/Objective:     Assessment Plan:  Left leg DVT [facility]) May 2023: Patient went to vein clinic and was told that he had developed a DVT in his left lower extremity.  (Prior history of venous stripping for varicose veins)   Current treatment: Xarelto.   Repeat ultrasound of leg 11/30/2021: No evidence of DVT Hypercoagulability work-up: Normal Antithrombin III, protein S, protein C.  Normal factor V Leiden and prothrombin gene mutations. Results are consistent with lupus anticoagulant.  Discussion/counseling: I discussed with patient extensively that the presence of lupus anticoagulant could indicate that the patient might need indefinite anticoagulation.  However in  order to confirm it we will recheck labs again in 3 months and follow-up after that. Continue anticoagulation until that time at least.  Return to clinic 3 months with labs and telephone visit after that to discuss results   I discussed the assessment and treatment plan with the patient. The patient was provided an opportunity to ask questions and all were answered. The patient agreed with the plan and demonstrated an understanding of the instructions. The patient was advised to call back or seek an in-person evaluation if the symptoms worsen or if the condition fails to improve as anticipated.   I provided 12 minutes of non-face-to-face time during this encounter.  This includes time for charting and coordination of care   Harriette Ohara, MD  I [name] am scribing for Dr. Lindi Adie  I have reviewed the above documentation for accuracy and completeness, and I agree with the above.

## 2022-01-11 ENCOUNTER — Inpatient Hospital Stay: Payer: BC Managed Care – PPO | Attending: Hematology and Oncology | Admitting: Hematology and Oncology

## 2022-01-11 DIAGNOSIS — I824Y2 Acute embolism and thrombosis of unspecified deep veins of left proximal lower extremity: Secondary | ICD-10-CM

## 2022-01-11 NOTE — Assessment & Plan Note (Signed)
Left leg DVT [facility]) May 2023: Patient went to vein clinic and was told that he had developed a DVT in his left lower extremity.  (Prior history of venous stripping for varicose veins)  Current treatment: Xarelto.  Repeat ultrasound of leg 11/30/2021: No evidence of DVT Hypercoagulability work-up: Normal Antithrombin III, protein S, protein C.  Normal factor V Leiden and prothrombin gene mutations. Results are consistent with lupus anticoagulant.  Discussion/counseling: I discussed with patient extensively that the presence of lupus anticoagulant could indicate that the patient might need indefinite anticoagulation.  However in order to confirm it we will recheck labs again in 3 months and follow-up after that. Continue anticoagulation until that time at least.  Return to clinic 3 months with labs and telephone visit after that to discuss results

## 2022-01-12 ENCOUNTER — Telehealth: Payer: Self-pay | Admitting: Hematology and Oncology

## 2022-01-12 NOTE — Telephone Encounter (Signed)
Scheduled appointment per 10/9 los. Patient is aware.

## 2022-02-04 DIAGNOSIS — Z0000 Encounter for general adult medical examination without abnormal findings: Secondary | ICD-10-CM | POA: Diagnosis not present

## 2022-02-04 DIAGNOSIS — R5383 Other fatigue: Secondary | ICD-10-CM | POA: Diagnosis not present

## 2022-02-04 DIAGNOSIS — Z125 Encounter for screening for malignant neoplasm of prostate: Secondary | ICD-10-CM | POA: Diagnosis not present

## 2022-02-04 DIAGNOSIS — K760 Fatty (change of) liver, not elsewhere classified: Secondary | ICD-10-CM | POA: Diagnosis not present

## 2022-02-04 DIAGNOSIS — F5101 Primary insomnia: Secondary | ICD-10-CM | POA: Diagnosis not present

## 2022-02-10 DIAGNOSIS — K760 Fatty (change of) liver, not elsewhere classified: Secondary | ICD-10-CM | POA: Diagnosis not present

## 2022-02-10 DIAGNOSIS — R5383 Other fatigue: Secondary | ICD-10-CM | POA: Diagnosis not present

## 2022-02-10 DIAGNOSIS — F5101 Primary insomnia: Secondary | ICD-10-CM | POA: Diagnosis not present

## 2022-02-10 DIAGNOSIS — Z0000 Encounter for general adult medical examination without abnormal findings: Secondary | ICD-10-CM | POA: Diagnosis not present

## 2022-04-13 ENCOUNTER — Inpatient Hospital Stay: Payer: BC Managed Care – PPO | Attending: Hematology and Oncology

## 2022-04-13 DIAGNOSIS — Z7901 Long term (current) use of anticoagulants: Secondary | ICD-10-CM | POA: Insufficient documentation

## 2022-04-13 DIAGNOSIS — I824Y2 Acute embolism and thrombosis of unspecified deep veins of left proximal lower extremity: Secondary | ICD-10-CM

## 2022-04-13 DIAGNOSIS — I82402 Acute embolism and thrombosis of unspecified deep veins of left lower extremity: Secondary | ICD-10-CM | POA: Insufficient documentation

## 2022-04-15 LAB — DRVVT CONFIRM: dRVVT Confirm: 1.5 ratio — ABNORMAL HIGH (ref 0.8–1.2)

## 2022-04-15 LAB — LUPUS ANTICOAGULANT PANEL
DRVVT: 65.3 s — ABNORMAL HIGH (ref 0.0–47.0)
PTT Lupus Anticoagulant: 60.2 s — ABNORMAL HIGH (ref 0.0–43.5)

## 2022-04-15 LAB — PTT-LA MIX: PTT-LA Mix: 46 s — ABNORMAL HIGH (ref 0.0–40.5)

## 2022-04-15 LAB — HEXAGONAL PHASE PHOSPHOLIPID: Hexagonal Phase Phospholipid: 6 s (ref 0–11)

## 2022-04-15 LAB — DRVVT MIX: dRVVT Mix: 47.3 s — ABNORMAL HIGH (ref 0.0–40.4)

## 2022-04-20 ENCOUNTER — Inpatient Hospital Stay (HOSPITAL_BASED_OUTPATIENT_CLINIC_OR_DEPARTMENT_OTHER): Payer: BC Managed Care – PPO | Admitting: Hematology and Oncology

## 2022-04-20 DIAGNOSIS — I824Y2 Acute embolism and thrombosis of unspecified deep veins of left proximal lower extremity: Secondary | ICD-10-CM

## 2022-04-20 NOTE — Assessment & Plan Note (Signed)
May 2023: Patient went to vein clinic and was told that he had developed a DVT in his left lower extremity.  (Prior history of venous stripping for varicose veins)   Current treatment: Xarelto.   Repeat ultrasound of leg 11/30/2021: No evidence of DVT Hypercoagulability work-up: Normal Antithrombin III, protein S, protein C.  Normal factor V Leiden and prothrombin gene mutations.  Results are consistent with lupus anticoagulant. Repeat testing 04/13/2022: Positive again  Recommendation: I discussed with the patient about indefinite anticoagulation.

## 2022-04-20 NOTE — Progress Notes (Addendum)
HEMATOLOGY-ONCOLOGY TELEPHONE VISIT PROGRESS NOTE  I connected with our patient on 04/20/22 at  2:30 PM EST by telephone and verified that I am speaking with the correct person using two identifiers.  I discussed the limitations, risks, security and privacy concerns of performing an evaluation and management service by telephone and the availability of in person appointments.  I also discussed with the patient that there may be a patient responsible charge related to this service. The patient expressed understanding and agreed to proceed.   History of Present Illness: Follow-up to discuss results of blood work for lupus anticoagulant Patient is complaining of bilateral knee and hip joint pains and arthritis symptoms.  REVIEW OF SYSTEMS:   Constitutional: Denies fevers, chills or abnormal weight loss All other systems were reviewed with the patient and are negative. Observations/Objective:     Assessment Plan:  Left leg DVT [facility]) May 2023: Patient went to vein clinic and was told that he had developed a DVT in his left lower extremity.  (Prior history of venous stripping for varicose veins)   Current treatment: Xarelto.   Repeat ultrasound of leg 11/30/2021: No evidence of DVT Hypercoagulability work-up: Normal Antithrombin III, protein S, protein C.  Normal factor V Leiden and prothrombin gene mutations.  Results are consistent with lupus anticoagulant. Repeat testing 04/13/2022: Positive again  Recommendation: Plan to recheck her lupus anticoagulant in 3 months and at this time he will stop Xarelto for 48 hours prior to the test so as to avoid any false positive readings.   I discussed the assessment and treatment plan with the patient. The patient was provided an opportunity to ask questions and all were answered. The patient agreed with the plan and demonstrated an understanding of the instructions. The patient was advised to call back or seek an in-person evaluation if the symptoms  worsen or if the condition fails to improve as anticipated.   I provided 12 minutes of non-face-to-face time during this encounter.  This includes time for charting and coordination of care   Harriette Ohara, MD  I [name] am acting as a scribe for Dr.Jeanne Diefendorf  I have reviewed the above documentation for accuracy and completeness, and I agree with the above.

## 2022-04-22 ENCOUNTER — Telehealth: Payer: Self-pay | Admitting: Hematology and Oncology

## 2022-04-22 NOTE — Telephone Encounter (Signed)
Scheduled appointments per 1/16 los. Patient is aware. 

## 2022-07-20 ENCOUNTER — Inpatient Hospital Stay: Payer: BC Managed Care – PPO | Attending: Hematology and Oncology

## 2022-07-20 DIAGNOSIS — Z86718 Personal history of other venous thrombosis and embolism: Secondary | ICD-10-CM | POA: Diagnosis not present

## 2022-07-20 DIAGNOSIS — Z7901 Long term (current) use of anticoagulants: Secondary | ICD-10-CM | POA: Diagnosis not present

## 2022-07-20 DIAGNOSIS — I824Y2 Acute embolism and thrombosis of unspecified deep veins of left proximal lower extremity: Secondary | ICD-10-CM

## 2022-07-21 LAB — ANTINUCLEAR ANTIBODIES, IFA: ANA Ab, IFA: NEGATIVE

## 2022-07-21 LAB — LUPUS ANTICOAGULANT PANEL
DRVVT: 55.2 s — ABNORMAL HIGH (ref 0.0–47.0)
PTT Lupus Anticoagulant: 43.3 s (ref 0.0–43.5)

## 2022-07-21 LAB — DRVVT MIX: dRVVT Mix: 45 s — ABNORMAL HIGH (ref 0.0–40.4)

## 2022-07-21 LAB — DRVVT CONFIRM: dRVVT Confirm: 1.3 ratio — ABNORMAL HIGH (ref 0.8–1.2)

## 2022-07-22 LAB — RHEUMATOID FACTOR: Rheumatoid fact SerPl-aCnc: <10 IU/mL (ref <14.0)

## 2022-07-23 NOTE — Progress Notes (Signed)
HEMATOLOGY-ONCOLOGY TELEPHONE VISIT PROGRESS NOTE  I connected with our patient on 07/27/22 at  2:45 PM EDT by telephone and verified that I am speaking with the correct person using two identifiers.  I discussed the limitations, risks, security and privacy concerns of performing an evaluation and management service by telephone and the availability of in person appointments.  I also discussed with the patient that there may be a patient responsible charge related to this service. The patient expressed understanding and agreed to proceed.   History of Present Illness: Phillip [name] 47 y.o. male is here because of a diagnosis of left leg DVT. She presents to the clinic for a telephone follow-up after rechecked her lupus anticoagulant and to discuss the test results.   Oncology History   No history exists.    REVIEW OF SYSTEMS:   Constitutional: Denies fevers, chills or abnormal weight loss All other systems were reviewed with the patient and are negative. Observations/Objective:     Assessment Plan:  Left leg DVT [facility]) May 2023: Patient went to vein clinic and was told that he had developed a DVT in his left lower extremity.  (Prior history of venous stripping for varicose veins)   Current treatment: Xarelto.   Repeat ultrasound of leg 11/30/2021: No evidence of DVT Hypercoagulability work-up: Normal Antithrombin III, protein S, protein C.  Normal factor V Leiden and prothrombin gene mutations.   Results are consistent with lupus anticoagulant. Repeat testing 04/13/2022: Positive again Repeat testing: 07/20/2022: Positive even with holding Xarelto.  (Rheumatoid factor and ANA were negative)  Recommendation: Lifelong anticoagulation. Follow-up with me on an as-needed basis.   I discussed the assessment and treatment plan with the patient. The patient was provided an opportunity to ask questions and all were answered. The patient agreed with the plan and demonstrated an understanding of  the instructions. The patient was advised to call back or seek an in-person evaluation if the symptoms worsen or if the condition fails to improve as anticipated.   I provided 12 minutes of non-face-to-face time during this encounter.  This includes time for charting and coordination of care   Phillip Meek, MD  I Phillip [name] am acting as a scribe for Phillip [name]  I have reviewed the above documentation for accuracy and completeness, and I agree with the above.

## 2022-07-27 ENCOUNTER — Inpatient Hospital Stay (HOSPITAL_BASED_OUTPATIENT_CLINIC_OR_DEPARTMENT_OTHER): Payer: BC Managed Care – PPO | Admitting: Hematology and Oncology

## 2022-07-27 DIAGNOSIS — I824Y2 Acute embolism and thrombosis of unspecified deep veins of left proximal lower extremity: Secondary | ICD-10-CM

## 2022-07-27 NOTE — Assessment & Plan Note (Signed)
May 2023: Patient went to vein clinic and was told that he had developed a DVT in his left lower extremity.  (Prior history of venous stripping for varicose veins)   Current treatment: Xarelto.   Repeat ultrasound of leg 11/30/2021: No evidence of DVT Hypercoagulability work-up: Normal Antithrombin III, protein S, protein C.  Normal factor V Leiden and prothrombin gene mutations.   Results are consistent with lupus anticoagulant. Repeat testing 04/13/2022: Positive again Repeat testing: 07/20/2022: Positive even with holding Xarelto.  Recommendation: Lifelong anticoagulation.

## 2022-08-18 DIAGNOSIS — F5101 Primary insomnia: Secondary | ICD-10-CM | POA: Diagnosis not present

## 2022-08-18 DIAGNOSIS — R5383 Other fatigue: Secondary | ICD-10-CM | POA: Diagnosis not present

## 2022-08-18 DIAGNOSIS — F320 Major depressive disorder, single episode, mild: Secondary | ICD-10-CM | POA: Diagnosis not present

## 2022-08-18 DIAGNOSIS — K760 Fatty (change of) liver, not elsewhere classified: Secondary | ICD-10-CM | POA: Diagnosis not present

## 2022-08-25 DIAGNOSIS — I82402 Acute embolism and thrombosis of unspecified deep veins of left lower extremity: Secondary | ICD-10-CM | POA: Diagnosis not present

## 2022-08-25 DIAGNOSIS — F320 Major depressive disorder, single episode, mild: Secondary | ICD-10-CM | POA: Diagnosis not present

## 2022-08-25 DIAGNOSIS — K760 Fatty (change of) liver, not elsewhere classified: Secondary | ICD-10-CM | POA: Diagnosis not present

## 2022-08-25 DIAGNOSIS — E669 Obesity, unspecified: Secondary | ICD-10-CM | POA: Diagnosis not present

## 2023-02-15 DIAGNOSIS — I82402 Acute embolism and thrombosis of unspecified deep veins of left lower extremity: Secondary | ICD-10-CM | POA: Diagnosis not present

## 2023-02-15 DIAGNOSIS — E669 Obesity, unspecified: Secondary | ICD-10-CM | POA: Diagnosis not present

## 2023-02-15 DIAGNOSIS — K760 Fatty (change of) liver, not elsewhere classified: Secondary | ICD-10-CM | POA: Diagnosis not present

## 2023-02-15 DIAGNOSIS — M10079 Idiopathic gout, unspecified ankle and foot: Secondary | ICD-10-CM | POA: Diagnosis not present

## 2023-02-17 ENCOUNTER — Ambulatory Visit (HOSPITAL_COMMUNITY)
Admission: EM | Admit: 2023-02-17 | Discharge: 2023-02-17 | Disposition: A | Discharge status: Home / Self Care | Payer: BC Managed Care – PPO

## 2023-02-17 DIAGNOSIS — R454 Irritability and anger: Secondary | ICD-10-CM

## 2023-02-17 DIAGNOSIS — Z79899 Other long term (current) drug therapy: Secondary | ICD-10-CM | POA: Diagnosis not present

## 2023-02-17 NOTE — ED Provider Notes (Incomplete)
Behavioral Health Urgent Care Medical Screening Exam  Patient Name: Phillip [name] MRN: [id number] Date of Evaluation: 02/17/23 Chief Complaint:  lost temper and control over the past few weeks.  Diagnosis:  Final diagnoses:  Outbursts of anger  Encounter for medication management    History of Present illness: Phillip [name] is a 47 y.o. male patient with a reported psychiatric history significant for depression, anxiety,  mood disorder/bipolar and compulsive disorder who presents to the [facility] behavioral health urgent care voluntarily accompanied by his wife with complaints of "lost temper and control over the past few weeks."  Patient states that he has had past mental health issues and treatments in the past. He states that for the past two to three weeks things have gotten difficult. He states that he has been working to get a psychiatrist to get his medications adjusted. He states that his medications may be the cause. He reports taking Klonopin as needed, Lexarpo and another medication that he is unable to recall that starts with an "F." He reports taking the stated medications for the past 4-5 years. He states that he needs to get his medications adjusted because he is losing control to the point that he does not know what he is doing. He states that he is able to gain control quickly and isolate himself. He states that he is usually triggered at home but for the past couple of days he's been triggered at work. He does not go into details as to who or what triggers an anger episode/outburst. He describes his episodes as yelling and screaming. He reports past anger episodes when he was 47 years old. He reports a history of poor sleep since he was a child and states that he sleeps at least one good hour per night and naps during the night. His wife states that in the past he was prescribed a mood stabilizer but the medication made him worse. The patient interjects his wife and states  that she does not need to provider that information if he is not getting admitted.  On evaluation, patient is alert and oriented x 4. His thought process is linear and speech is clear and coherent. His mood is anxious/irritable and affect is congruent. He denies active SI/HI/AVH. There is no objective evidence that the patient is currently responding to internal or external stimuli.  Plan of care: I discussed with the patient that I do not suspect that his symptoms are in the context of an adverse reaction to his current medication regimen as he has been prescribed these medications for the past 4-5 years. Patient's lack of sleep may be attributing to his symptoms.  I also explored the possibility of recent stressors that may be attributing to the patient's symptoms however he does not verbalize any new stressors. I discussed with the patient recommendations for outpatient psychiatry for medication management. Patient did not appear to be happy with the recommendation and states that he has tried calling around to establish care but has not been able to get any immediate treatment. I discussed with the patient consulting with the social worker here at the [facility] behavioral health urgent care to see if we could establish a new patient appointment for medication management within the next couple days.  Patient scheduled an appointment with the [facility] on Tuesday, November 19, at 1 PM. Patient to continue working with therapist to address to have concerns related to anger episodes. Safety planning completed.  Patient safely discharged.    Flowsheet Row ED from 02/17/2023 in [facility] Admission (Discharged) from 06/12/2021 in [place] PERIOPERATIVE AREA  C-SSRS RISK CATEGORY Low Risk No Risk       Psychiatric Specialty Exam  Presentation  General Appearance:Appropriate for Environment  Eye Contact:Fair  Speech:Clear and  Coherent  Speech Volume:Normal  Handedness:Right   Mood and Affect  Mood: Anxious/irritable  Affect: Congruent   Thought Process  Thought Processes: Coherent  Descriptions of Associations:Intact  Orientation:Full (Time, Place and Person)  Thought Content:Logical    Hallucinations:None  Ideas of Reference:None  Suicidal Thoughts:No  Homicidal Thoughts:No   Sensorium  Memory: Immediate Fair; Recent Fair; Remote Fair  Judgment: Fair  Insight:Fair  Executive Functions  Concentration: Fair  Attention Span: Fair  Recall: [employer] of Knowledge: Fair  Language: Fair   Psychomotor Activity  Psychomotor Activity: Normal   Assets  Assets: Manufacturing systems engineer; Desire for Improvement; Leisure Time; Physical Health; Resilience; Social Support; English as a second language teacher; Advertising copywriter; Health and safety inspector; Housing; Intimacy   Sleep  Sleep: Poor   Physical Exam: Physical Exam HENT:     Head: Normocephalic.     Nose: Nose normal.  Eyes:     Conjunctiva/sclera: Conjunctivae normal.  Cardiovascular:     Rate and Rhythm: Normal rate.  Pulmonary:     Effort: Pulmonary effort is normal.  Musculoskeletal:        General: Normal range of motion.     Cervical back: Normal range of motion.  Neurological:     Mental Status: He is alert and oriented to person, place, and time.    Review of Systems  Constitutional: Negative.   HENT: Negative.    Eyes: Negative.   Respiratory: Negative.    Cardiovascular: Negative.   Gastrointestinal: Negative.   Genitourinary: Negative.   Musculoskeletal: Negative.   Neurological: Negative.   Endo/Heme/Allergies: Negative.    Blood pressure (!) 129/93, pulse (!) 108, temperature 97.9 F (36.6 C), temperature source Oral, resp. rate 18, SpO2 98%. There is no height or weight on file to calculate BMI.  Musculoskeletal: Strength & Muscle Tone: within normal limits Gait & Station: normal Patient  leans: N/A   [facility] MSE Discharge Disposition for Follow up and Recommendations: Based on my evaluation the patient does not appear to have an emergency medical condition and can be discharged with resources and follow up care in outpatient services for Medication Management, Individual Therapy, and Group Therapy  ischarge recommendations:   Medications: Patient is to take medications as prescribed. No medication changes were made during your visit. The patient or patient's guardian is to contact a medical professional and/or outpatient provider to address any new side effects that develop. The patient or the patient's guardian should update outpatient providers of any new medications and/or medication changes.   Outpatient Follow up: You are scheduled for an appointment with Hillery Jacks on 02/22/23 at 1 pm at the [facility].  Therapy: We recommend that patient participate in individual therapy to address mental health concerns.  Safety:   The following safety precautions should be taken:   No sharp objects. This includes scissors, razors, scrapers, and putty knives.   Chemicals should be removed and locked up.   Medications should be removed and locked up.   Weapons should be removed and locked up. This includes firearms, knives and instruments that can be used to cause injury.   The patient should abstain from use of illicit substances/drugs and abuse of  any medications.  If symptoms worsen or do not continue to improve or if the patient becomes actively suicidal or homicidal then it is recommended that the patient return to the closest hospital emergency department, the [facility], or call 911 for further evaluation and treatment. National Suicide Prevention Lifeline 1-800-SUICIDE or (917) 534-9719.  About 988 988 offers 24/7 access to trained crisis counselors who can help people experiencing mental health-related  distress. People can call or text 988 or chat 988lifeline.org for themselves or if they are worried about a loved one who may need crisis support.    Dearius Hoffmann L, NP 02/17/2023, 1:50 PM

## 2023-02-17 NOTE — Progress Notes (Signed)
   02/17/23 1235  [facility] Triage Screening (Walk-ins at [facility] only)  How Did You Hear About [place]? Family/Friend  What Is the Reason for Your Visit/Call Today? Pt presents to [facility] voluntarily accompanied by his wife. Pt states that over the past few weeks he lost his temper and he lost control. Pt states that he believes it could be his medication. Pt does have a therapist but doesn't have a psychiatrist. Pt states that he has daily SI thoughts but feels that they are just passive thoughts. Pt denies HI, AVH and drug use at this present time. Pt states that he had a glass of wine last night.  How Long Has This Been Causing You Problems? 1 wk - 1 month  Have You Recently Had Any Thoughts About Hurting Yourself? Yes  How long ago did you have thoughts about hurting yourself? daily - no plan  Are You Planning to Commit Suicide/Harm Yourself At This time? No  Have you Recently Had Thoughts About Hurting Someone Karolee Ohs? No  Are You Planning To Harm Someone At This Time? No  Are you currently experiencing any auditory, visual or other hallucinations? No  Have You Used Any Alcohol or Drugs in the Past 24 Hours? Yes  How long ago did you use Drugs or Alcohol? last night  What Did You Use and How Much? a glass of medication  Do you have any current medical co-morbidities that require immediate attention? No  Clinician description of patient physical appearance/behavior: neatly dressed, calm, cooperative  What Do You Feel Would Help You the Most Today? Social Support;Medication(s)  If access to [facility] Urgent Care was not available, would you have sought care in the Emergency Department? No  Determination of Need Routine (7 days)  Options For Referral Medication Management;Outpatient Therapy

## 2023-02-17 NOTE — ED Provider Notes (Signed)
Behavioral Health Urgent Care Medical Screening Exam  Patient Name: Phillip [name] MRN: [id number] Date of Evaluation: 02/17/23 Chief Complaint:  lost temper and control over the past few weeks.  Diagnosis:  Final diagnoses:  Outbursts of anger  Encounter for medication management    History of Present illness: Phillip [name] is a 47 y.o. male patient with a reported psychiatric history significant for depression, anxiety,  mood disorder/bipolar and compulsive disorder who presents to the [facility] behavioral health urgent care voluntarily accompanied by his wife with complaints of "lost temper and control over the past few weeks."  Patient states that he has had past mental health issues and treatments in the past. He states that for the past two to three weeks things have gotten difficult. He states that he has been working to get a psychiatrist to get his medications adjusted. He states that his medications may be the cause. He reports taking Klonopin as needed, Lexarpo and another medication that he is unable to recall that starts with an "F." He reports taking the stated medications for the past 4-5 years. He states that he needs to get his medications adjusted because he is losing control to the point that he does not know what he is doing. He states that he is able to gain control quickly and isolate himself. He states that he is usually triggered at home but for the past couple of days he's been triggered at work. He does not go into details as to who or what triggers an anger episode/outburst. He describes his episodes as yelling and screaming. He reports past anger episodes when he was 47 years old. He reports a history of poor sleep since he was a child and states that he sleeps at least one good hour per night and naps during the night. His wife states that in the past he was prescribed a mood stabilizer but the medication made him worse. The patient interjects his wife and states  that she does not need to provider that information if he is not getting admitted.  On evaluation, patient is alert and oriented x 4. His thought process is linear and speech is clear and coherent. His mood is anxious/irritable and affect is congruent. He denies active SI/HI/AVH. There is no objective evidence that the patient is currently responding to internal or external stimuli.  Plan of care: I discussed with the patient that I do not suspect that his symptoms are in the context of an adverse reaction to his current medication regimen as he has been prescribed these medications for the past 4-5 years. Patient's lack of sleep may be attributing to his symptoms.  I also explored the possibility of recent stressors that may be attributing to the patient's symptoms however he does not verbalize any new stressors. I discussed with the patient recommendations for outpatient psychiatry for medication management. Patient did not appear to be happy with the recommendation and states that he has tried calling around to establish care but has not been able to get any immediate treatment. I discussed with the patient consulting with the social worker here at the [facility] behavioral health urgent care to see if we could establish a new patient appointment for medication management within the next couple days.  Patient scheduled an appointment with the [facility] on Tuesday, November 19, at 1 PM. Patient to continue working with therapist to address to have concerns related to anger episodes. Safety planning completed.  Patient safely discharged.    Flowsheet Row ED from 02/17/2023 in [facility] Admission (Discharged) from 06/12/2021 in [place] PERIOPERATIVE AREA  C-SSRS RISK CATEGORY Low Risk No Risk       Psychiatric Specialty Exam  Presentation  General Appearance:Appropriate for Environment  Eye Contact:Fair  Speech:Clear and  Coherent  Speech Volume:Normal  Handedness:Right   Mood and Affect  Mood: Anxious/irritable  Affect: Congruent   Thought Process  Thought Processes: Coherent  Descriptions of Associations:Intact  Orientation:Full (Time, Place and Person)  Thought Content:Logical    Hallucinations:None  Ideas of Reference:None  Suicidal Thoughts:No  Homicidal Thoughts:No   Sensorium  Memory: Immediate Fair; Recent Fair; Remote Fair  Judgment: Fair  Insight:Fair  Executive Functions  Concentration: Fair  Attention Span: Fair  Recall: [employer] of Knowledge: Fair  Language: Fair   Psychomotor Activity  Psychomotor Activity: Normal   Assets  Assets: Manufacturing systems engineer; Desire for Improvement; Leisure Time; Physical Health; Resilience; Social Support; English as a second language teacher; Advertising copywriter; Health and safety inspector; Housing; Intimacy   Sleep  Sleep: Poor   Physical Exam: Physical Exam HENT:     Head: Normocephalic.     Nose: Nose normal.  Eyes:     Conjunctiva/sclera: Conjunctivae normal.  Cardiovascular:     Rate and Rhythm: Normal rate.  Pulmonary:     Effort: Pulmonary effort is normal.  Musculoskeletal:        General: Normal range of motion.     Cervical back: Normal range of motion.  Neurological:     Mental Status: He is alert and oriented to person, place, and time.    Review of Systems  Constitutional: Negative.   HENT: Negative.    Eyes: Negative.   Respiratory: Negative.    Cardiovascular: Negative.   Gastrointestinal: Negative.   Genitourinary: Negative.   Musculoskeletal: Negative.   Neurological: Negative.   Endo/Heme/Allergies: Negative.    Blood pressure (!) 129/93, pulse (!) 108, temperature 97.9 F (36.6 C), temperature source Oral, resp. rate 18, SpO2 98%. There is no height or weight on file to calculate BMI.  Musculoskeletal: Strength & Muscle Tone: within normal limits Gait & Station: normal Patient  leans: N/A   [facility] MSE Discharge Disposition for Follow up and Recommendations: Based on my evaluation the patient does not appear to have an emergency medical condition and can be discharged with resources and follow up care in outpatient services for Medication Management, Individual Therapy, and Group Therapy  ischarge recommendations:   Medications: Patient is to take medications as prescribed. No medication changes were made during your visit. The patient or patient's guardian is to contact a medical professional and/or outpatient provider to address any new side effects that develop. The patient or the patient's guardian should update outpatient providers of any new medications and/or medication changes.   Outpatient Follow up: You are scheduled for an appointment with Hillery Jacks on 02/22/23 at 1 pm at the [facility].  Therapy: We recommend that patient participate in individual therapy to address mental health concerns.  Safety:   The following safety precautions should be taken:   No sharp objects. This includes scissors, razors, scrapers, and putty knives.   Chemicals should be removed and locked up.   Medications should be removed and locked up.   Weapons should be removed and locked up. This includes firearms, knives and instruments that can be used to cause injury.   The patient should abstain from use of illicit substances/drugs and abuse of  any medications.  If symptoms worsen or do not continue to improve or if the patient becomes actively suicidal or homicidal then it is recommended that the patient return to the closest hospital emergency department, the [facility], or call 911 for further evaluation and treatment. National Suicide Prevention Lifeline 1-800-SUICIDE or (865) 336-1268.  About 988 988 offers 24/7 access to trained crisis counselors who can help people experiencing mental health-related  distress. People can call or text 988 or chat 988lifeline.org for themselves or if they are worried about a loved one who may need crisis support.    Layla Barter, NP 02/17/2023, 6:57 PM

## 2023-02-17 NOTE — Discharge Instructions (Addendum)
Discharge recommendations:   Medications: Patient is to take medications as prescribed. No medication changes were made during your visit. The patient or patient's guardian is to contact a medical professional and/or outpatient provider to address any new side effects that develop. The patient or the patient's guardian should update outpatient providers of any new medications and/or medication changes.   Outpatient Follow up: Please review list of outpatient resources for psychiatry for medication management.   Therapy: We recommend that patient participate in individual therapy to address mental health concerns.  Safety:   The following safety precautions should be taken:   No sharp objects. This includes scissors, razors, scrapers, and putty knives.   Chemicals should be removed and locked up.   Medications should be removed and locked up.   Weapons should be removed and locked up. This includes firearms, knives and instruments that can be used to cause injury.   The patient should abstain from use of illicit substances/drugs and abuse of any medications.  If symptoms worsen or do not continue to improve or if the patient becomes actively suicidal or homicidal then it is recommended that the patient return to the closest hospital emergency department, the [facility], or call 911 for further evaluation and treatment. National Suicide Prevention Lifeline 1-800-SUICIDE or (567) 216-0830.  About 988 988 offers 24/7 access to trained crisis counselors who can help people experiencing mental health-related distress. People can call or text 988 or chat 988lifeline.org for themselves or if they are worried about a loved one who may need crisis support.   Please contact one of the following facilities to start medication management and therapy services:   [facility] [address] [place] [address]  [place], [place] [address] [phone]   [facility]  [address] [place], [place] [address] [phone]  [facility] [place]  [address] Vella Raring [place], [place] [address] [phone]  [facility]  [address]  [place], [place] [address] [phone]  [facility]  [address] [place], [place] [address] [phone]  [facility]  [address],  [place], [place] [address] [phone]

## 2023-02-22 ENCOUNTER — Ambulatory Visit (HOSPITAL_BASED_OUTPATIENT_CLINIC_OR_DEPARTMENT_OTHER): Payer: BC Managed Care – PPO | Admitting: Family

## 2023-02-22 ENCOUNTER — Encounter (HOSPITAL_COMMUNITY): Payer: Self-pay | Admitting: Family

## 2023-02-22 VITALS — BP 136/91 | HR 77 | Ht 72.0 in | Wt 278.0 lb

## 2023-02-22 DIAGNOSIS — F331 Major depressive disorder, recurrent, moderate: Secondary | ICD-10-CM

## 2023-02-22 DIAGNOSIS — F411 Generalized anxiety disorder: Secondary | ICD-10-CM

## 2023-02-22 MED ORDER — HYDROXYZINE PAMOATE 25 MG PO CAPS: 25.0000 mg | ORAL_CAPSULE | Freq: Every evening | ORAL | 0 refills | Status: DC | PRN

## 2023-02-22 MED ORDER — FLUVOXAMINE MALEATE 25 MG PO TABS: 75.0000 mg | ORAL_TABLET | Freq: Every day | ORAL | 0 refills | Status: DC

## 2023-02-22 MED ORDER — HYDROXYZINE PAMOATE 25 MG PO CAPS: 25.0000 mg | ORAL_CAPSULE | Freq: Three times a day (TID) | ORAL | 0 refills | Status: DC | PRN

## 2023-02-22 NOTE — Progress Notes (Signed)
Psychiatric Initial Adult Assessment   Patient Identification: Phillip Phillip [name] MRN:  [id number] Date of Evaluation:  02/22/2023 Referral Source: [facility] urgent care Chief Complaint:  depression, mood irritability and anger outburst    Visit Diagnosis: No diagnosis found.  History of Present Illness:  Phillip Phillip [name] 47 year old Caucasian male presents to establish care.  He reports a diagnosis related to depression/anxiety and mood disorder.  He reports he was followed by psychiatry and therapy services through [facility] however,  states that he has not followed up in the past 2 years.  He reports he was followed by Phillip Phillip [name] and therapist Phillip Phillip [name].  he reports his mood had improved and did not need psychiatry or therapy services at that time.  States his primary care provider had been writing his psychotropic medications with Luvox 50 mg Phillip [name] and clonazepam 0.5mg  as needed.  States he did not take that medication often.  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  States he has been married since 1997.  Reports his wife is supportive.  States they have 2 children.  Reports the relationship between he and his children.  He reports more recently having a "mental breakdown."  He reports he took some time away from work as he was experiencing worthlessness and passive suicidal ideations.  Reports his mood was out-of-control and has had "fits of rage".  He reports his symptoms include increased depression, mood irritability, increased anxiety, and work related issues.  Passive suicidal ideations denying plan or intent.  Sleep issues panic/anxiety attacks and obsessive thoughts.  States he noted that his greatest change significantly in the past 4 weeks.  Reports he has been using delta 8 occasionally.  Reported his alcohol use has increased to 3-4 times a day.  However due to high stress and manage with his work-related schedule he has been  drinking every other night.  No other illicit drug use i.e. cocaine, heroin or marijuana.  PHQ-9 21 GAD-7 16  Major depressive disorder: Generalized anxiety disorder: Mood disorder unspecified:  Increase Luvox 50 mg to Luvox 75 mg Phillip [name]-consideration for titration to Luvox 100 mg at 2-week follow-up appointment. Discussed this provider will not prescribe clonazepam 0.5mg  as patient reports he does not take medication as indicated.  States he continues to have a full bottle of medications available. Continue hydroxyzine 25 mg p.o. nightly may repeat for sedation/sleep disturbance- Discussed for breakthrough anxiety this provider will make hydroxyzine 10 mg available twice Phillip [name] if needed Discussed following up with therapy services in office, as patient is requesting a local therapist Discussed consideration for intensive outpatient programming and/or partial hospitalization programming if needed for short-term disability/FMLA, patient declined states he is able to return back to work at this time.  He reports a family history related to mental illness.  Reports his mother and father both diagnosed with depression and anxiety.  States his father deals with anger and mood irritability.  Unsure if they have been treated or followed by therapy and psychiatry.  Patient reports previous hospitalization in 1997 for suicide attempt.  States he was using a Efedrin stimulant.   During evaluation Phillip Phillip [name] is sitting; he is alert/oriented x 4; calm/cooperative; and mood congruent with affect.  Patient is speaking in a clear tone at moderate volume, and normal pace; with good eye contact.  His  thought process is coherent and relevant; There is no indication that he is currently responding to internal/external stimuli or experiencing  delusional thought content.  Patient denies suicidal/self-harm/homicidal ideation, psychosis, and paranoia.  Patient has remained calm throughout assessment and has answered  questions appropriately.  Associated Signs/Symptoms: Depression Symptoms:  difficulty concentrating, anxiety, panic attacks, (Hypo) Manic Symptoms:  Impulsivity, Irritable Mood, Anxiety Symptoms:  Excessive Worry, Psychotic Symptoms:  Hallucinations: None PTSD Symptoms: NA  Past Psychiatric History:   Previous Psychotropic Medications: Yes   Substance Abuse History in the last 12 months:  Yes.   Reported using Delta 8  Consequences of Substance Abuse: NA  Past Medical History:  Past Medical History:  Diagnosis Date   Anxiety    Borderline high blood pressure    Borderline hyperlipidemia    Depression    GERD (gastroesophageal reflux disease)    History of hiatal hernia    NASH (nonalcoholic steatohepatitis)    Varicose veins of both legs with edema    history of    Past Surgical History:  Procedure Laterality Date   BREATH TEK H PYLORI N/A 12/19/2012   Procedure: BREATH TEK H PYLORI;  Surgeon: [name], MD;  Location: [facility];  Service: General;  Laterality: N/A;  Pt is scheduled at 730   LAPAROSCOPIC GASTRIC BANDING WITH HIATAL HERNIA REPAIR  06/26/2013   Procedure: LAPAROSCOPIC GASTRIC BANDING WITH HIATAL HERNIA REPAIR;  Surgeon: [name], MD;  Location: [facility];  Service: General;;   TONSILLECTOMY AND ADENOIDECTOMY Bilateral 06/12/2021   Procedure: TONSILLECTOMY;  Surgeon: Osborn Coho, MD;  Location: [facility];  Service: ENT;  Laterality: Bilateral;   UMBILICAL HERNIA REPAIR  06/26/2013   Procedure: HERNIA REPAIR UMBILICAL ADULT;  Surgeon: [name], MD;  Location: [facility];  Service: General;;    Family Psychiatric History:   Family History:  Family History  Problem Relation Age of Onset   Cancer Maternal Grandmother        colon   COPD Other    Hypertension Other    Hyperlipidemia Other    Obesity Other     Social History:   Social History   Socioeconomic History   Marital status: Married    Spouse name: Not on file    Number of children: Not on file   Years of education: Not on file   Highest education level: Not on file  Occupational History   Not on file  Tobacco Use   Smoking status: Never   Smokeless tobacco: Former    Types: Chew    Quit date: 04/05/2010  Substance and Sexual Activity   Alcohol use: Yes    Alcohol/week: 6.0 standard drinks of alcohol    Types: 2 Glasses of wine, 2 Cans of beer, 2 Shots of liquor per week   Drug use: No   Sexual activity: Not on file  Other Topics Concern   Not on file  Social History Narrative   Not on file   Social Determinants of Health   Financial Resource Strain: Not on file  Food Insecurity: Not on file  Transportation Needs: Not on file  Physical Activity: Not on file  Stress: Not on file  Social Connections: Unknown (08/18/2021)   [facility]   Social Network    Social Network: Not on file    Additional Social History:   Allergies:   Allergies  Allergen Reactions   Penicillin G     Other reaction(s): swelling rash   Penicillins Hives, Rash and Swelling    Metabolic Disorder Labs: No results found for: "HGBA1C", "MPG" No results  found for: "PROLACTIN" Lab Results  Component Value Date   CHOL 138 01/27/2007   TRIG 64 01/27/2007   HDL 44.1 01/27/2007   CHOLHDL 3.1 CALC 01/27/2007   VLDL 13 01/27/2007   LDLCALC 81 01/27/2007   Lab Results  Component Value Date   TSH 2.522 11/22/2012    Therapeutic Level Labs: No results found for: "LITHIUM" No results found for: "CBMZ" No results found for: "VALPROATE"  Current Medications: Current Outpatient Medications  Medication Sig Dispense Refill   clonazePAM (KLONOPIN) 0.5 MG tablet TAKE ONE TABLET AT BEDTIME AS NEEDED FOR SLEEP (Patient taking differently: Take 0.5 mg by mouth at bedtime.) 30 tablet 2   fluvoxaMINE (LUVOX) 50 MG tablet Take 50 mg by mouth at bedtime.     rivaroxaban (XARELTO) 2.5 MG TABS tablet 1 tablet     No current  facility-administered medications for this visit.    Musculoskeletal: Strength & Muscle Tone: within normal limits Gait & Station: normal Patient leans: N/A  Psychiatric Specialty Exam: Review of Systems  Psychiatric/Behavioral:  Positive for behavioral problems and sleep disturbance. The patient is nervous/anxious.   All other systems reviewed and are negative.   There were no vitals taken for this visit.There is no height or weight on file to calculate BMI.  General Appearance: Casual  Eye Contact:  Good  Speech:  Clear and Coherent  Volume:  Normal  Mood:  Anxious and Depressed  Affect:  Congruent  Thought Process:  Coherent  Orientation:  Full (Time, Place, and Person)  Thought Content:  Logical  Suicidal Thoughts:  No  Homicidal Thoughts:  No  Memory:  Immediate;   Good Recent;   Good  Judgement:  Good  Insight:  Good  Psychomotor Activity:  Normal  Concentration:  Concentration: Good  Recall:  Good  Fund of Knowledge:Good  Language: Good  Akathisia:  No  Handed:  Right  AIMS (if indicated):  not done  Assets:  Communication Skills Desire for Improvement Social Support  ADL's:  Intact  Cognition: WNL  Sleep:  Good   Screenings: Flowsheet Row ED from 02/17/2023 in [facility] Admission (Discharged) from 06/12/2021 in [place] [facility]  C-SSRS RISK CATEGORY Low Risk No Risk       Assessment and Plan: Phillip Phillip [name] 47 year old Caucasian male presents to establish care.  Reports he carries a diagnosis related to depression and anxiety.  States he had been stable on medications for the past 2 years as he was followed by [facility] for therapy and psychiatry services.  However,  states more recently he has been experiencing mood irritability, sleep disturbance and increased anxiety.  Discussed titrating medication if he is currently prescribed.  Patient to follow-up 3 weeks for medication tolerability/adherence.  See  treatment plan below.   Treatment Plan:   Major depressive disorder: Generalized anxiety disorder: Mood disorder unspecified:  Increase Luvox 50 mg to Luvox 75 mg Phillip [name]-consideration for titration to Luvox 100 mg at 2-week follow-up appointment.  -Discussed this provider will not prescribe clonazepam 0.5mg  as patient reports he does not take medication as indicated.  States he continues to have a full bottle of medications available.  -Continue hydroxyzine 25 mg p.o. nightly may repeat for sedation/sleep disturbance Discussed for breakthrough anxiety this provider will make hydroxyzine 10 mg available twice Phillip [name] if needed  -Discussed following up with therapy services in office, as patient is requesting a local therapist - Discussed the need to start a mood stabilizer, ie  tegretol  -Discussed consideration for intensive outpatient programming and/or partial hospitalization programming if needed for short-term disability/FMLA, patient declined states he is able to return back to work at this time.  Collaboration of Care: Medication Management AEB increase Luvox 50 mg to 75 mg Phillip [name], continue hydroxyzine 25 mg as needed.  Patient/Guardian was advised Release of Information must be obtained prior to any record release in order to collaborate their care with an outside provider. Patient/Guardian was advised if they have not already done so to contact the registration department to sign all necessary forms in order for [place] to release information regarding their care.   Consent: Patient/Guardian gives verbal consent for treatment and assignment of benefits for services provided during this visit. Patient/Guardian expressed understanding and agreed to proceed.   [name], NP 11/19/202412:59 PM

## 2023-02-24 ENCOUNTER — Ambulatory Visit (HOSPITAL_COMMUNITY): Payer: BC Managed Care – PPO | Admitting: Licensed Clinical Social Worker

## 2023-02-24 DIAGNOSIS — F331 Major depressive disorder, recurrent, moderate: Secondary | ICD-10-CM | POA: Diagnosis not present

## 2023-02-24 DIAGNOSIS — F411 Generalized anxiety disorder: Secondary | ICD-10-CM
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
## 2023-03-09 ENCOUNTER — Telehealth (HOSPITAL_COMMUNITY): Payer: BC Managed Care – PPO | Admitting: Family

## 2023-03-09 DIAGNOSIS — G479 Sleep disorder, unspecified: Secondary | ICD-10-CM

## 2023-03-09 DIAGNOSIS — F331 Major depressive disorder, recurrent, moderate: Secondary | ICD-10-CM

## 2023-03-09 DIAGNOSIS — F411 Generalized anxiety disorder: Secondary | ICD-10-CM

## 2023-03-09 MED ORDER — HYDROXYZINE PAMOATE 25 MG PO CAPS: 25.0000 mg | ORAL_CAPSULE | Freq: Every evening | ORAL | 0 refills | Status: AC | PRN

## 2023-03-09 MED ORDER — FLUVOXAMINE MALEATE 25 MG PO TABS: 75.0000 mg | ORAL_TABLET | Freq: Every day | ORAL | 2 refills | Status: AC

## 2023-03-09 NOTE — Progress Notes (Signed)
Virtual Visit via Video Note  I connected with Phillip [name] on 03/09/23 at 12:30 PM EST by a video enabled telemedicine application and verified that I am speaking with the correct person using two identifiers.  Location: Patient: [place]  Provider: Office   I discussed the limitations of evaluation and management by telemedicine and the availability of in person appointments. The patient expressed understanding and agreed to proceed.   I discussed the assessment and treatment plan with the patient. The patient was provided an opportunity to ask questions and all were answered. The patient agreed with the plan and demonstrated an understanding of the instructions.   The patient was advised to call back or seek an in-person evaluation if the symptoms worsen or if the condition fails to improve as anticipated.  I provided 00 minutes of non-face-to-face time during this encounter.   [name], NP   [facility] MD/PA/NP OP Progress Note  03/09/2023 1:59 PM Phillip [name]  MRN:  [id number]  Chief Complaint: Medication management  HPI: Phillip [name] 47 year old Caucasian male has a diagnosis with major depressive disorder, generalized anxiety disorder and intermittent explosive disorder.  He is currently prescribed Luvox and hydroxyzine for mood stabilization.  Visit Diagnosis:    ICD-10-CM   1. Moderate episode of recurrent major depressive disorder (HCC)  F33.1     2. Generalized anxiety disorder  F41.1       Past Psychiatric History: Phillip [name] 47 year old Caucasian male presents for medication management appointment.  Carries diagnosis with major depressive disorder generalized anxiety disorder and explosive mood disorder.  Per initial visit Luvox 50 mg was titrated to 75 mg daily.  He reports he has been taking and tolerating medications well.  States he feels like his mood has stabilized.  Continues to endorse issues related to insomnia/sleep disturbance.  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  He was receptive to plan.  No concerns related to suicidal or homicidal ideations.  Major Depression:  Generalized anxiety disorder: Sleep disturbance:  Will continue Luvox clovoxamine 75 mg daily Continue hydroxyzine 50 mg  to 100 mg nightly  During evaluation Phillip [name] is sitting in his vehicle as he reports he is on his lunch break. he is alert/oriented x 4; calm/cooperative; and mood congruent with affect.  Patient is speaking in a clear tone at moderate volume, and normal pace; with good eye contact.  His  thought process is coherent and relevant; There is no indication that he is currently responding to internal/external stimuli or experiencing delusional thought content.  Patient denies suicidal/self-harm/homicidal ideation, psychosis, and paranoia.  Patient has remained calm throughout assessment and has answered questions appropriately.    Past Medical History:  Past Medical History:  Diagnosis Date   Anxiety    Borderline high blood pressure    Borderline hyperlipidemia    Depression    GERD (gastroesophageal reflux disease)    History of hiatal hernia    NASH (nonalcoholic steatohepatitis)    Varicose veins of both legs with edema    history of    Past Surgical History:  Procedure Laterality Date   BREATH TEK H PYLORI N/A 12/19/2012   Procedure: BREATH TEK H PYLORI;  Surgeon: [name], MD;  Location: [facility];  Service: General;  Laterality: N/A;  Pt is scheduled at 730  LAPAROSCOPIC GASTRIC BANDING WITH HIATAL HERNIA REPAIR  06/26/2013   Procedure: LAPAROSCOPIC GASTRIC BANDING WITH HIATAL HERNIA REPAIR;  Surgeon: [name], MD;  Location: [facility];  Service: General;;   TONSILLECTOMY AND ADENOIDECTOMY Bilateral 06/12/2021   Procedure: TONSILLECTOMY;  Surgeon: Osborn Coho, MD;   [facility];  Service: ENT;  Laterality: Bilateral;   UMBILICAL HERNIA REPAIR  06/26/2013   Procedure: HERNIA REPAIR UMBILICAL ADULT;  Surgeon: [name], MD;  Location: [facility];  Service: General;;    Family Psychiatric History: See HPI  Family History:  Family History  Problem Relation Age of Onset   Cancer Maternal Grandmother        colon   COPD Other    Hypertension Other    Hyperlipidemia Other    Obesity Other     Social History:  Social History   Socioeconomic History   Marital status: Married    Spouse name: Not on file   Number of children: Not on file   Years of education: Not on file   Highest education level: Not on file  Occupational History   Not on file  Tobacco Use   Smoking status: Never   Smokeless tobacco: Former    Types: Chew    Quit date: 04/05/2010  Substance and Sexual Activity   Alcohol use: Not Currently    Comment: three drinks a day   Drug use: Yes    Types: Marijuana   Sexual activity: Yes    Birth control/protection: Sponge, None  Other Topics Concern   Not on file  Social History Narrative   Not on file   Social Determinants of Health   Financial Resource Strain: Not on file  Food Insecurity: Not on file  Transportation Needs: Not on file  Physical Activity: Not on file  Stress: Not on file  Social Connections: Unknown (08/18/2021)   [facility]   Social Network    Social Network: Not on file    Allergies:  Allergies  Allergen Reactions   Penicillin G     Other reaction(s): swelling rash   Penicillins Hives, Rash and Swelling    Metabolic Disorder Labs: No results found for: "HGBA1C", "MPG" No results found for: "PROLACTIN" Lab Results  Component Value Date   CHOL 138 01/27/2007   TRIG 64 01/27/2007   HDL 44.1 01/27/2007   CHOLHDL 3.1 CALC 01/27/2007   VLDL 13 01/27/2007   LDLCALC 81 01/27/2007   Lab Results  Component Value Date   TSH 2.522 11/22/2012     Therapeutic Level Labs: No results found for: "LITHIUM" No results found for: "VALPROATE" No results found for: "CBMZ"  Current Medications: Current Outpatient Medications  Medication Sig Dispense Refill   clonazePAM (KLONOPIN) 0.5 MG tablet TAKE ONE TABLET AT BEDTIME AS NEEDED FOR SLEEP (Patient taking differently: Take 0.5 mg by mouth at bedtime.) 30 tablet 2   fluvoxaMINE (LUVOX) 25 MG tablet Take 3 tablets (75 mg total) by mouth daily at 8 pm. 90 tablet 0   hydrOXYzine (VISTARIL) 25 MG capsule Take 1 capsule (25 mg total) by mouth at bedtime as needed and may repeat dose one time if needed. 90 capsule 0   rivaroxaban (XARELTO) 2.5 MG TABS tablet 1 tablet     No current facility-administered medications for this visit.     Musculoskeletal:  Virtual telemetry assessment patient observed sitting in his vehicle throughout this assessment  Psychiatric Specialty Exam: Review of Systems  Psychiatric/Behavioral:  Positive  for sleep disturbance. The patient is nervous/anxious.   All other systems reviewed and are negative.   There were no vitals taken for this visit.There is no height or weight on file to calculate BMI.  General Appearance: Casual  Eye Contact:  Good  Speech:  Clear and Coherent  Volume:  Normal  Mood:  Anxious and Depressed  Affect:  Congruent  Thought Process:  Coherent  Orientation:  Full (Time, Place, and Person)  Thought Content: Logical   Suicidal Thoughts:  No  Homicidal Thoughts:  No  Memory:  Immediate;   Good Remote;   Good  Judgement:  Good  Insight:  Good  Psychomotor Activity: Observed sitting in his vehicle  Concentration:  Concentration: Good  Recall:  Good  Fund of Knowledge: Good  Language: Good  Akathisia:  No  Handed:  Right  AIMS (if indicated): not done  Assets:  Communication Skills Desire for Improvement Resilience Social Support  ADL's:  Intact  Cognition: WNL  Sleep:  Fair   Screenings: GAD-7    Occupational psychologist from 02/24/2023 in [facility]  Total GAD-7 Score 8      PHQ2-9    Flowsheet Row Counselor from 02/24/2023 in [facility] Visit from [facility]/22/2023 [facility]  PHQ-2 Total Score 4 5  PHQ-9 Total Score 13 21      Flowsheet Row Counselor from 02/24/2023 in [facility] Visit from 02/22/2023 in [facility] from 02/17/2023 in [facility]  C-SSRS RISK CATEGORY Moderate Risk Error: Q3, 4, or 5 should not be populated when Q2 is No Low Risk        Assessment and Plan: Phillip [name] 47 year old Caucasian male presents for medication management follow-up appointment.  He carries a diagnosis related to adjustment disorder, major depressive disorder and generalized anxiety disorder.  Per previous visit patient's Luvox (fluvoxamine) was titrated from 50 mg to 75 mg which she reports taking and tolerating well.  Collaboration of Care: Collaboration of Care: Medication Management AEB continue Luvox 75 mg daily may titrate hydroxyzine 50mg  times 1 repeat nightly as needed  Patient/Guardian was advised Release of Information must be obtained prior to any record release in order to collaborate their care with an outside provider. Patient/Guardian was advised if they have not already done so to contact the registration department to sign all necessary forms in order for [place] to release information regarding their care.   Consent: Patient/Guardian gives verbal consent for treatment and assignment of benefits for services provided during this visit. Patient/Guardian expressed understanding and agreed to proceed.    [name], NP 03/09/2023, 1:59 PM

## 2023-03-28 DIAGNOSIS — D6862 Lupus anticoagulant syndrome: Secondary | ICD-10-CM | POA: Diagnosis not present

## 2023-03-28 DIAGNOSIS — I82402 Acute embolism and thrombosis of unspecified deep veins of left lower extremity: Secondary | ICD-10-CM | POA: Diagnosis not present

## 2023-03-28 DIAGNOSIS — Z0000 Encounter for general adult medical examination without abnormal findings: Secondary | ICD-10-CM | POA: Diagnosis not present

## 2023-03-28 DIAGNOSIS — K760 Fatty (change of) liver, not elsewhere classified: Secondary | ICD-10-CM | POA: Diagnosis not present

## 2023-03-31 ENCOUNTER — Ambulatory Visit (HOSPITAL_COMMUNITY): Payer: BC Managed Care – PPO | Admitting: Licensed Clinical Social Worker

## 2023-03-31 DIAGNOSIS — F411 Generalized anxiety disorder: Secondary | ICD-10-CM

## 2023-03-31 DIAGNOSIS — F331 Major depressive disorder, recurrent, moderate: Secondary | ICD-10-CM | POA: Diagnosis not present

## 2023-03-31 NOTE — Progress Notes (Signed)
THERAPIST PROGRESS NOTE   Session Time: 1:00pm  -  2:00pm     Location: Patient: [facility] Provider: [facility]   Participation Level: Active    Behavioral Response: Alert, casually dressed, anxious mood/affect   Type of Therapy:  Individual Therapy   Treatment Goals addressed: Management of depression and anxiety;  Reducing panic attacks at work; Medication compliance; Maintaining employment  Progress Towards Goals: Progressing    Interventions: CBT, anger management    Summary: Earma Reading, who prefers to go by "Ed" is a 47 year old married Caucasian male that presented today with diagnoses of Major Depressive Disorder, recurrent, moderate; and Generalized Anxiety Disorder.          Suicidal/Homicidal: None; without intent or plan.     Therapist Response:  Clinician met with Earma Reading, who prefers to go by "Ed" today for in-person therapy appointment and assessed for safety, medication compliance, and sobriety.  Ed presented for session on time and was alert, oriented x5, with no evidence or self-report of active SI/HI or A/V H.  Ed denied any abuse of  illicit substances.  Ed reported that he has been averaging 1 bottle of wine split with his wife most nights.  He reported compliance with behavioral medication.  Clinician inquired about Ed's current emotional ratings, as well as any significant changes in thoughts, feelings or behavior since previous check-in.  Ed reported scores of 5/10 for depression, 5/10 for anxiety, and 0/10 for anger/irritability.  Ed denied any recent panic attacks.  Ed reported that a recent success was enjoying the Christmas holiday with family, stating "It was a celebration".  He reported that a struggle has been worrying about his job, and managing outbursts, since he has felt very overwhelmed. Ed reported that he hopes therapy can help him build greater insight into his outbursts, stating "My feelings always come out as anger, and I've never  understood why that happens".  Clinician utilized an Building surveyor handout with Ed in session to assist.  This handout explained how anger tends to be an emotion that is easily identifiable due to outward behavior such as frequent emotional outbursts, but can conceal other difficult emotions under the surface if compartmentalization is used.  A list of common emotions linked to anger was provided, and Ed was tasked with identifying which ones could be having a present influence.  Clinician also encouraged Ed to consider self-care activities which could offer an appropriate stress outlet, and explored the subject with a list of various hobbies that could be helpful to include in routine.  Intervention was effective, as evidenced by Ed actively engaging in discussion on subject, reporting that he has been compartmentalizing emotions since he was very young, and realized that this might have been passed on from his parents, stating "I remember a lot of yelling, but nothing I can pinpoint".  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  Ed reported that some emotions he has bottled up under the surface include frustration, helplessness, insecurity, and fear.  He reported that he hopes taking timeouts and meditating when panic symptoms present will help him manage until he can find a new, less stressful job to transition to, stating "Its time for me to move on".  Clinician will continue to monitor.   Plan: Follow up in 2 weeks.   Diagnosis:  Major Depressive Disorder, recurrent, moderate; and Generalized Anxiety Disorder.  Collaboration of Care:   No collaboration of care required for this visit.                                                   Patient/Guardian was advised Release of Information must be obtained prior to any record release in order to collaborate their care with an outside  provider. Patient/Guardian was advised if they have not already done so to contact the registration department to sign all necessary forms in order for [place] to release information regarding their care.    Consent: Patient/Guardian gives verbal consent for treatment and assignment of benefits for services provided during this visit. Patient/Guardian expressed understanding and agreed to proceed.   Noralee Stain, [place], LCAS 03/31/23

## 2023-05-02 ENCOUNTER — Ambulatory Visit (INDEPENDENT_AMBULATORY_CARE_PROVIDER_SITE_OTHER): Payer: BC Managed Care – PPO | Admitting: Licensed Clinical Social Worker

## 2023-05-02 DIAGNOSIS — F331 Major depressive disorder, recurrent, moderate: Secondary | ICD-10-CM | POA: Diagnosis not present

## 2023-05-02 DIAGNOSIS — F411 Generalized anxiety disorder: Secondary | ICD-10-CM | POA: Diagnosis not present

## 2023-05-02 NOTE — Progress Notes (Signed)
THERAPIST PROGRESS NOTE   Session Time: 3:00pm  -  4:00pm   Location: Patient[facility]: [facility]   Participation Level: Active    Behavioral Response: Alert, casually dressed, anxious mood/affect   Type of Therapy:  Individual Therapy   Treatment Goals addressed: Management of depression and anxiety; Medication compliance  Progress Towards Goals: Progressing    Interventions: CBT: challenging anxious thoughts    Summary: Phillip [name], who prefers to go by "Phillip [name]" is a 48 year old married Caucasian male that presented today with diagnoses of Major Depressive Disorder, recurrent, moderate; and Generalized Anxiety Disorder.          Suicidal/Homicidal: None; without intent or plan.     Therapist Response:  Clinician met with Phillip [name], who prefers to go by "Phillip [name]" today for in-person therapy session and assessed for safety, medication compliance, and sobriety.  Phillip [name] presented for appointment on time and was alert, oriented x5, with no evidence or self-report of active SI/HI or A/V H.  Phillip [name] denied any abuse of  illicit substances.  Phillip [name] reported that he has continued averaging a half bottle of wine shared with his wife several nights per week.  He reported compliance with behavioral medication.  Clinician inquired about Phillip [name]'s emotional ratings today, as well as any significant changes in thoughts, feelings or behavior since last check-in.  Phillip [name] reported scores of 4/10 for depression, 4/10 for anxiety, and 0/10 for anger/irritability.  Phillip [name] denied any recent panic attacks or outbursts.  Phillip [name] reported that a recent success has been putting in roughly 20-30 job applications since our last meeting in order to acquire a less stressful job.  He reported that a struggle has been lack of response from these hirers, which has him worried, stating "I feel like I'm a perfect fit for these positions, so I don't understand whats going on".  Clinician utilized handout in session today titled "Worry  exploration" in order to assist Phillip [name] in reducing his anxiety related to job hunt.  This worksheet featured a series of Socratic questions aimed at exploring the most likely outcomes for a situation of concern, rather than focusing on the worst possible outcome (i.e. catastrophizing).  Clinician assisted Phillip [name] in identifying and challenging any irrational beliefs related to this worry, in addition to utilizing problem solving approach to explore strategies which would help him accomplish goal of acquiring a new job which is less stressful, offers fair pay, and benefits.  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  He reported that he has also been able to establish a better boundary at work lately, so if this transition process to a new job takes longer than planned, he believes he can tolerate the stress.  Intervention was effective, as evidenced by Phillip [name] reporting that discussion on this subject reduced his anxiety regarding inability to find a job quickly, and increased his confidence in present skillset and experience in field.  Phillip [name] stated "I'm keeping as positive as I can.  Ultimately its time for me to go, and having the chance to talk about this out loud makes me feel  better about eventually finding the right job for me out there".  Clinician will continue to monitor.   Plan: Follow up in 2 weeks.   Diagnosis: Major Depressive Disorder, recurrent, moderate; and Generalized Anxiety Disorder.  Collaboration of Care:   No collaboration of care required for this visit.                                                   Patient/Guardian  was advised Release of Information must be obtained prior to any record release in order to collaborate their care with an outside provider. Patient/Guardian was advised if they have not already done so to contact the registration department to sign all necessary forms in order for [place] to release information regarding their care.    Consent: Patient/Guardian gives verbal consent for treatment and assignment of benefits for services provided during this visit. Patient/Guardian expressed understanding and agreed to proceed.   Noralee Stain, [place], LCAS 05/02/23

## 2023-05-30 ENCOUNTER — Ambulatory Visit (INDEPENDENT_AMBULATORY_CARE_PROVIDER_SITE_OTHER): Payer: BC Managed Care – PPO | Admitting: Licensed Clinical Social Worker

## 2023-05-30 DIAGNOSIS — F331 Major depressive disorder, recurrent, moderate: Secondary | ICD-10-CM | POA: Diagnosis not present

## 2023-05-30 DIAGNOSIS — F411 Generalized anxiety disorder: Secondary | ICD-10-CM | POA: Diagnosis not present

## 2023-05-30 NOTE — Progress Notes (Signed)
 THERAPIST PROGRESS NOTE   Session Time: 3:00pm  -  4:00pm   Location: [facility]: [facility]   Participation Level: Active    Behavioral Response: Alert, casually dressed, anxious mood/affect   Type of Therapy:  Individual Therapy   Treatment Goals addressed: Management of depression and anxiety; Medication compliance; Maintaining employment   Progress Towards Goals: Progressing    Interventions: CBT: challenging core beliefs    Summary: Phillip [name], who prefers to go by "Phillip [name]" is a 48 year old married Caucasian male that presented today with diagnoses of Major Depressive Disorder, recurrent, moderate; and Generalized Anxiety Disorder.          Suicidal/Homicidal: None; without intent or plan.     Therapist Response:  Clinician met with Phillip [name], who prefers to go by "Phillip [name]" today for in-person therapy appointment and assessed for safety, medication compliance, and sobriety.  Phillip [name] presented for session on time and was alert, oriented x5, with no evidence or self-report of active SI/HI or A/V H.  Phillip [name] denied any abuse of illicit substances.  Phillip [name] reported that he has maintained habit of averaging a half bottle of wine shared with his wife several nights per week, but denied any consequences.  He reported compliance with behavioral medication.  Clinician inquired about Phillip [name]'s current emotional ratings, as well as any significant changes in thoughts, feelings or behavior since previous check-in.  Phillip [name] reported scores of 4/10 for depression, 4/10 for anxiety, and 4/10 for anger/irritability.  Phillip [name] denied any recent panic attacks or outbursts.  Phillip [name] reported that a recent success was paying someone to update his resume and cover letter to make him more competitive, which has led to several job interviews being scheduled.  Phillip [name] reported that he is optimistic that he will find one soon that meets his expectations, and offer a secure transition from current stressful job.  Phillip [name] reported  that he has talked to HR and the boss at his job about his intentions, but they are unwilling to work with him, stating "I don't want to leave, but I can't work with him.  He's cold as ice".  Phillip [name] reported that his boss said he isn't doing a good job, and this led to discussion on Phillip [name]'s perception of his overall self-worth and abilities.  Phillip [name] stated "I'm at the peak of my life right now, but it still feels like it isn't enough".  Clinician was agreeable to this, and utilized a handout on core beliefs with Phillip [name] to guide discussion.  This handout defined core beliefs as the most central ideas one holds about themselves, which can also influence automatic negative thoughts.  Clinician utilized CBT concept to explain how this thinking affects feelings and behavior in turn, and examples of common negative core beliefs were listed, such as "I am a failure", "I am unlovable", and "Nothing ever goes right".  Consequences of allowing core beliefs to remain were listed as well, including tendency to distrust others, aggressive behavior, unhealthy boundaries, and worsening anxiety, depression, or reliance upon drugs/alcohol to cope.  Clinician tasked Phillip [name] with identifying some of his negative core beliefs, and discussed strategies for countering negative thoughts in order to resolve them, such as reciting daily positive affirmations, or looking for evidence that contradicts these beliefs.  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  Phillip [name] reported that this unrealistic perspective has left him very stressed, and unsatisfied with where he is currently at in life.  Phillip [name] was able to identify several pieces of evidence to contradict this core belief, noting that he has raised a healthy family, has education and skills that have  helped in excel in his field and provide for them, and he is in the process of building up self-worth by getting into the gym again.  Phillip [name] reported that talking about this subject was "Eye opening" and stated "I've been hunting for my parents' approval this whole time".  Clinician will continue to monitor.   Plan: Follow up in 2 weeks.   Diagnosis: Major Depressive Disorder, recurrent, moderate; and Generalized Anxiety Disorder.  Collaboration of Care:   No collaboration of care required for this visit.                                                   Patient/Guardian was advised Release of Information must be obtained prior to any record release in order to collaborate their care with an outside provider. Patient/Guardian was advised if they have not already done so to contact the registration department to sign all necessary forms in order for [place] to release information regarding their care.    Consent: Patient/Guardian gives verbal consent for treatment and assignment of benefits for services provided during this visit. Patient/Guardian expressed understanding and agreed to proceed.   Noralee Stain, LCSW, LCAS 05/30/23

## 2023-06-03 DIAGNOSIS — M5416 Radiculopathy, lumbar region: Secondary | ICD-10-CM | POA: Diagnosis not present

## 2023-06-07 ENCOUNTER — Telehealth (HOSPITAL_COMMUNITY): Payer: BC Managed Care – PPO | Admitting: Family

## 2023-06-07 DIAGNOSIS — F411 Generalized anxiety disorder: Secondary | ICD-10-CM

## 2023-06-07 DIAGNOSIS — F331 Major depressive disorder, recurrent, moderate: Secondary | ICD-10-CM

## 2023-06-07 NOTE — Progress Notes (Signed)
 BH MD/PA/NP OP Progress Note  06/07/2023 1:03 PM KLYE BESECKER  MRN:  [id number]  Chief Complaint: Medication management  HPI: Phillip [name] 48 year old presents with a history related to major depressive disorder generalized anxiety disorder.  Patient has concerns related to FMLA papers being completed due to intense pressure at work.  Ed stated " I had a bad day one day last week,and I had to  push through and come to work."  Reports his job can be stressful causing intense anxiety symptoms.  Ed reports he does have enough time to cover missed days however, does not want to compromise his employment at this time.  Discussed following up with partial hospitalization programming and/or intensive outpatient programming.  Patient declined stating he is not requesting a longer leaves seeking intermittent time off as needed.  Saul currently prescribed Zoloft 75 mg daily.  He reports he has been taking medication more diligently.  Denied medication refills at this time.  No concerns related to medication side effects.  Chart review patient has been followed by counseling services.  No concerns related to suicidal or homicidal ideations.  Reports overall his mood is stabilized.  Patient to follow-up 3 months for medication management.   Visit Diagnosis:    ICD-10-CM   1. Moderate episode of recurrent major depressive disorder (HCC)  F33.1     2. Generalized anxiety disorder  F41.1       Past Psychiatric History: Major depressive disorder, generalized anxiety disorder.  Currently prescribed hydroxyzine, Luvox trazodone as needed.  Denied previous inpatient admissions.  Reported he did complete intensive outpatient programming in the past.  Past Medical History:  Past Medical History:  Diagnosis Date   Anxiety    Borderline high blood pressure    Borderline hyperlipidemia    Depression    GERD (gastroesophageal reflux disease)    History of hiatal hernia    NASH (nonalcoholic  steatohepatitis)    Varicose veins of both legs with edema    history of    Past Surgical History:  Procedure Laterality Date   BREATH TEK H PYLORI N/A 12/19/2012   Procedure: BREATH TEK H PYLORI;  Surgeon: [name], MD;  Location: [facility];  Service: General;  Laterality: N/A;  Pt is scheduled at 730   LAPAROSCOPIC GASTRIC BANDING WITH HIATAL HERNIA REPAIR  06/26/2013   Procedure: LAPAROSCOPIC GASTRIC BANDING WITH HIATAL HERNIA REPAIR;  Surgeon: [name], MD;  Location: [facility];  Service: General;;   TONSILLECTOMY AND ADENOIDECTOMY Bilateral 06/12/2021   Procedure: TONSILLECTOMY;  Surgeon: Osborn Coho, MD;  Location: [facility] OR;  Service: ENT;  Laterality: Bilateral;   UMBILICAL HERNIA REPAIR  06/26/2013   Procedure: HERNIA REPAIR UMBILICAL ADULT;  Surgeon: [name], MD;  Location: [facility];  Service: General;;    Family Psychiatric History: See chart  Family History:  Family History  Problem Relation Age of Onset   Cancer Maternal Grandmother        colon   COPD Other    Hypertension Other    Hyperlipidemia Other    Obesity Other     Social History:  Social History   Socioeconomic History   Marital status: Married    Spouse name: Not on file   Number of children: Not on file   Years of education: Not on file   Highest education level: Not on file  Occupational History   Not on file  Tobacco Use   Smoking status: Never   Smokeless tobacco:  Former    Types: Chew    Quit date: 04/05/2010  Substance and Sexual Activity   Alcohol use: Not Currently    Comment: three drinks a day   Drug use: Yes    Types: Marijuana   Sexual activity: Yes    Birth control/protection: Sponge, None  Other Topics Concern   Not on file  Social History Narrative   Not on file   Social Drivers of Health   Financial Resource Strain: Not on file  Food Insecurity: Not on file  Transportation Needs: Not on file  Physical Activity: Not on file  Stress: Not on file   Social Connections: Unknown (08/18/2021)   [facility]   Social Network    Social Network: Not on file    Allergies:  Allergies  Allergen Reactions   Penicillin G     Other reaction(s): swelling rash   Penicillins Hives, Rash and Swelling    Metabolic Disorder Labs: No results found for: "HGBA1C", "MPG" No results found for: "PROLACTIN" Lab Results  Component Value Date   CHOL 138 01/27/2007   TRIG 64 01/27/2007   HDL 44.1 01/27/2007   CHOLHDL 3.1 CALC 01/27/2007   VLDL 13 01/27/2007   LDLCALC 81 01/27/2007   Lab Results  Component Value Date   TSH 2.522 11/22/2012    Therapeutic Level Labs: No results found for: "LITHIUM" No results found for: "VALPROATE" No results found for: "CBMZ"  Current Medications: Current Outpatient Medications  Medication Sig Dispense Refill   clonazePAM (KLONOPIN) 0.5 MG tablet TAKE ONE TABLET AT BEDTIME AS NEEDED FOR SLEEP (Patient taking differently: Take 0.5 mg by mouth at bedtime.) 30 tablet 2   fluvoxaMINE (LUVOX) 25 MG tablet Take 3 tablets (75 mg total) by mouth daily at 8 pm. 90 tablet 2   hydrOXYzine (VISTARIL) 25 MG capsule Take 1 capsule (25 mg total) by mouth at bedtime as needed and may repeat dose one time if needed for anxiety. 90 capsule 0   rivaroxaban (XARELTO) 2.5 MG TABS tablet 1 tablet     No current facility-administered medications for this visit.     Musculoskeletal: Virtual platform  Psychiatric Specialty Exam: Review of Systems  There were no vitals taken for this visit.There is no height or weight on file to calculate BMI.  General Appearance: Casual  Eye Contact:  Good  Speech:  Clear and Coherent  Volume:  Normal  Mood:  Anxious and Depressed  Affect:  Congruent  Thought Process:  Coherent  Orientation:  Full (Time, Place, and Person)  Thought Content: Logical   Suicidal Thoughts:  No  Homicidal Thoughts:  No  Memory:  Immediate;   Good Recent;   Good  Judgement:   Good  Insight:  Good  Psychomotor Activity:  Normal  Concentration:  Concentration: Good  Recall:  Good  Fund of Knowledge: Good  Language: Good  Akathisia:  No  Handed:  Right  AIMS (if indicated): done  Assets:  Communication Skills Desire for Improvement Resilience Social Support  ADL's:  Intact  Cognition: WNL  Sleep:  Good   Screenings: GAD-7    Advertising copywriter from 02/24/2023 in [facility] at [facility]  Total GAD-7 Score 8      PHQ2-9    Flowsheet Row Counselor from 02/24/2023 in [facility] at [facility] Visit [facility]  PHQ-2 Total Score 4 5  PHQ-9 Total Score 13  21      Flowsheet Row Counselor from 02/24/2023 in [facility] at [facility] Visit from 02/22/2023 in [facility] from 02/17/2023 in [facility]  C-SSRS RISK CATEGORY Moderate Risk Error: Q3, 4, or 5 should not be populated when Q2 is No Low Risk        Assessment and Plan: Jaymarion "Ed" Varney Daily 48 year old Caucasian male presents for medication follow-up appointment.  Currently he is prescribed and hydroxyzine which she reports he has been taking and tolerating well.  No medication side effects.  States he is now taking medications diligently feels that his mood is stabilized.  No concerns for refills at this time.  Reports he was prescribed 50 mg tablets by his primary care provider and has been splitting the other tablet totaling 75 mg.  Patient request this provider to complete FMLA paperwork discussed policies related to established care 6 months or greater.  He appeared receptive to plan.  Encouraged to follow-up with either intensive outpatient programming and/or partial hospitalization programming.  Consideration for following up with primary care.  Support,  encouragement  and reassurance was provided.  Collaboration of Care: Collaboration of Care: Medication Management AEB continue Luvox 75 mg daily  Patient/Guardian was advised Release of Information must be obtained prior to any record release in order to collaborate their care with an outside provider. Patient/Guardian was advised if they have not already done so to contact the registration department to sign all necessary forms in order for [place] to release information regarding their care.   Consent: Patient/Guardian gives verbal consent for treatment and assignment of benefits for services provided during this visit. Patient/Guardian expressed understanding and agreed to proceed.    [name], NP 06/07/2023, 1:03 PM

## 2023-06-14 DIAGNOSIS — R262 Difficulty in walking, not elsewhere classified: Secondary | ICD-10-CM | POA: Diagnosis not present

## 2023-06-14 DIAGNOSIS — M6281 Muscle weakness (generalized): Secondary | ICD-10-CM | POA: Diagnosis not present

## 2023-06-14 DIAGNOSIS — M5416 Radiculopathy, lumbar region: Secondary | ICD-10-CM | POA: Diagnosis not present

## 2023-06-16 DIAGNOSIS — M5416 Radiculopathy, lumbar region: Secondary | ICD-10-CM | POA: Diagnosis not present

## 2023-06-16 DIAGNOSIS — F332 Major depressive disorder, recurrent severe without psychotic features: Secondary | ICD-10-CM | POA: Diagnosis not present

## 2023-06-16 DIAGNOSIS — F411 Generalized anxiety disorder: Secondary | ICD-10-CM | POA: Diagnosis not present

## 2023-06-17 NOTE — Progress Notes (Signed)
 Virtual Visit via Video Note  I connected with Phillip [name] on 06/07/23 at  1:00 PM EST by a video enabled telemedicine application and verified that I am speaking with the correct person using two identifiers.  Location: Patient: home Provider: office   I discussed the limitations of evaluation and management by telemedicine and the availability of in person appointments. The patient expressed understanding and agreed to proceed.   I discussed the assessment and treatment plan with the patient. The patient was provided an opportunity to ask questions and all were answered. The patient agreed with the plan and demonstrated an understanding of the instructions.   The patient was advised to call back or seek an in-person evaluation if the symptoms worsen or if the condition fails to improve as anticipated.  I provided 15 minutes of non-face-to-face time during this encounter.   [name], NP   [facility] MD/PA/NP OP Progress Note  06/07/2023 8:58 AM Phillip [name]  MRN:  [id number]  Chief Complaint: Medication management  HPI: Phillip [name] 48 year old presents with a history related to major depressive disorder generalized anxiety disorder.  Patient has concerns related to FMLA papers being completed due to intense pressure at work.  Phillip [name] stated " I had a bad day one day last week,and I had to  push through and come to work."  Reports his job can be stressful causing intense anxiety symptoms.  Phillip [name] reports he does have enough time to cover missed days however, does not want to compromise his employment at this time.  Discussed following up with partial hospitalization programming and/or intensive outpatient programming.  Patient declined stating he is not requesting a longer leaves seeking intermittent time off as needed.  Phillip [name] currently prescribed Zoloft 75 mg [name].  He reports he has been taking medication more diligently.  Denied medication refills at this time.  No concerns related to  medication side effects.  Chart review patient has been followed by counseling services.  No concerns related to suicidal or homicidal ideations.  Reports overall his mood is stabilized.  Patient to follow-up 3 months for medication management.   Visit Diagnosis:    ICD-10-CM   1. Moderate episode of recurrent major depressive disorder (HCC)  F33.1     2. Generalized anxiety disorder  F41.1       Past Psychiatric History: Major depressive disorder, generalized anxiety disorder.  Currently prescribed hydroxyzine, Luvox trazodone as needed.  Denied previous inpatient admissions.  Reported he did complete intensive outpatient programming in the past.  Past Medical History:  Past Medical History:  Diagnosis Date   Anxiety    Borderline high blood pressure    Borderline hyperlipidemia    Depression    GERD (gastroesophageal reflux disease)    History of hiatal hernia    NASH (nonalcoholic steatohepatitis)    Varicose veins of both legs with edema    history of    Past Surgical History:  Procedure Laterality Date   BREATH TEK H PYLORI N/A 12/19/2012   Procedure: BREATH TEK H PYLORI;  Surgeon: [name], MD;  Location: [facility];  Service: General;  Laterality: N/A;  Pt is scheduled at 730   LAPAROSCOPIC GASTRIC BANDING WITH HIATAL HERNIA REPAIR  06/26/2013   Procedure: LAPAROSCOPIC GASTRIC BANDING WITH HIATAL HERNIA REPAIR;  Surgeon: [name], MD;  Location: [facility];  Service: General;;   TONSILLECTOMY AND ADENOIDECTOMY Bilateral 06/12/2021   Procedure: TONSILLECTOMY;  Surgeon: Osborn Coho, MD;  Location: [facility];  Service: ENT;  Laterality: Bilateral;   UMBILICAL HERNIA REPAIR  06/26/2013   Procedure: HERNIA REPAIR UMBILICAL ADULT;  Surgeon: [name], MD;  Location: [facility];  Service: General;;    Family Psychiatric History: See chart  Family History:  Family History  Problem Relation Age of Onset   Cancer Maternal Grandmother        colon   COPD  Other    Hypertension Other    Hyperlipidemia Other    Obesity Other     Social History:  Social History   Socioeconomic History   Marital status: Married    Spouse name: Not on file   Number of children: Not on file   Years of education: Not on file   Highest education level: Not on file  Occupational History   Not on file  Tobacco Use   Smoking status: Never   Smokeless tobacco: Former    Types: Chew    Quit date: 04/05/2010  Substance and Sexual Activity   Alcohol use: Not Currently    Comment: three drinks a day   Drug use: Yes    Types: Marijuana   Sexual activity: Yes    Birth control/protection: Sponge, None  Other Topics Concern   Not on file  Social History Narrative   Not on file   Social Drivers of Health   Financial Resource Strain: Not on file  Food Insecurity: Not on file  Transportation Needs: Not on file  Physical Activity: Not on file  Stress: Not on file  Social Connections: Unknown (08/18/2021)   Received from [facility]   Social Network    Social Network: Not on file    Allergies:  Allergies  Allergen Reactions   Penicillin G     Other reaction(s): swelling rash   Penicillins Hives, Rash and Swelling    Metabolic Disorder Labs: No results found for: "HGBA1C", "MPG" No results found for: "PROLACTIN" Lab Results  Component Value Date   CHOL 138 01/27/2007   TRIG 64 01/27/2007   HDL 44.1 01/27/2007   CHOLHDL 3.1 CALC 01/27/2007   VLDL 13 01/27/2007   LDLCALC 81 01/27/2007   Lab Results  Component Value Date   TSH 2.522 11/22/2012    Therapeutic Level Labs: No results found for: "LITHIUM" No results found for: "VALPROATE" No results found for: "CBMZ"  Current Medications: Current Outpatient Medications  Medication Sig Dispense Refill   clonazePAM (KLONOPIN) 0.5 MG tablet TAKE ONE TABLET AT BEDTIME AS NEEDED FOR SLEEP (Patient taking differently: Take 0.5 mg by mouth at bedtime.) 30 tablet 2   fluvoxaMINE  (LUVOX) 25 MG tablet Take 3 tablets (75 mg total) by mouth [name] at 8 pm. 90 tablet 2   hydrOXYzine (VISTARIL) 25 MG capsule Take 1 capsule (25 mg total) by mouth at bedtime as needed and may repeat dose one time if needed for anxiety. 90 capsule 0   rivaroxaban (XARELTO) 2.5 MG TABS tablet 1 tablet     No current facility-administered medications for this visit.     Musculoskeletal: Virtual platform  Psychiatric Specialty Exam: Review of Systems  There were no vitals taken for this visit.There is no height or weight on file to calculate BMI.  General Appearance: Casual  Eye Contact:  Good  Speech:  Clear and Coherent  Volume:  Normal  Mood:  Anxious and Depressed  Affect:  Congruent  Thought Process:  Coherent  Orientation:  Full (Time, Place, and Person)  Thought Content: Logical  Suicidal Thoughts:  No  Homicidal Thoughts:  No  Memory:  Immediate;   Good Recent;   Good  Judgement:  Good  Insight:  Good  Psychomotor Activity:  Normal  Concentration:  Concentration: Good  Recall:  Good  Fund of Knowledge: Good  Language: Good  Akathisia:  No  Handed:  Right  AIMS (if indicated): done  Assets:  Communication Skills Desire for Improvement Resilience Social Support  ADL's:  Intact  Cognition: WNL  Sleep:  Good   Screenings: GAD-7    Advertising copywriter from 02/24/2023 in [facility]  Total GAD-7 Score 8      PHQ2-9    Flowsheet Row Counselor from 02/24/2023 in [facility] Visit from 02/22/2023 in [facility]  PHQ-2 Total Score 4 5  PHQ-9 Total Score 13 21      Flowsheet Row Counselor from 02/24/2023 in [facility] Visit from 02/22/2023 in [facility] Phillip [name] from 02/17/2023 in [facility]  C-SSRS RISK CATEGORY  Moderate Risk Error: Q3, 4, or 5 should not be populated when Q2 is No Low Risk        Assessment and Plan: Phillip [name] 48 year old Caucasian male presents for medication follow-up appointment.  Currently he is prescribed and hydroxyzine which she reports he has been taking and tolerating well.  No medication side effects.  States he is now taking medications diligently feels that his mood is stabilized.  No concerns for refills at this time.  Reports he was prescribed 50 mg tablets by his primary care provider and has been splitting the other tablet totaling 75 mg.  Patient request this provider to complete FMLA paperwork discussed policies related to established care 6 months or greater.  He appeared receptive to plan.  Encouraged to follow-up with either intensive outpatient programming and/or partial hospitalization programming.  Consideration for following up with primary care.  Support, encouragement  and reassurance was provided.  Collaboration of Care: Collaboration of Care: Medication Management AEB continue Luvox 75 mg [name]  Patient/Guardian was advised Release of Information must be obtained prior to any record release in order to collaborate their care with an outside provider. Patient/Guardian was advised if they have not already done so to contact the registration department to sign all necessary forms in order for [place] to release information regarding their care.   Consent: Patient/Guardian gives verbal consent for treatment and assignment of benefits for services provided during this visit. Patient/Guardian expressed understanding and agreed to proceed.    [name], NP 06/07/2023, 8:58 AM

## 2023-06-27 ENCOUNTER — Ambulatory Visit (HOSPITAL_COMMUNITY): Payer: BC Managed Care – PPO | Admitting: Licensed Clinical Social Worker

## 2023-07-25 ENCOUNTER — Ambulatory Visit (HOSPITAL_COMMUNITY): Payer: BC Managed Care – PPO | Admitting: Licensed Clinical Social Worker

## 2023-08-14 ENCOUNTER — Encounter (HOSPITAL_COMMUNITY): Payer: Self-pay | Admitting: Emergency Medicine

## 2023-08-14 ENCOUNTER — Ambulatory Visit (HOSPITAL_COMMUNITY): Admission: EM | Admit: 2023-08-14 | Discharge: 2023-08-14 | Disposition: A | Discharge status: Home / Self Care

## 2023-08-14 DIAGNOSIS — S0181XA Laceration without foreign body of other part of head, initial encounter: Secondary | ICD-10-CM

## 2023-08-14 MED ORDER — LIDOCAINE-EPINEPHRINE 1 %-1:100000 IJ SOLN
INTRAMUSCULAR | Status: AC
  Filled 2023-08-14: qty 1

## 2023-08-14 NOTE — ED Provider Notes (Signed)
 [facility]  Note:  This document was prepared using Dragon voice recognition software and may include unintentional dictation errors.  MRN: [id number] DOB: June 29, 1975  Subjective:   Phillip [name] is a 48 y.o. male presenting for a laceration to the right eyebrow that occurred proximately an hour prior to arrival.  Patient reports that he became lightheaded after standing states that he sat back down but believes that he lost consciousness and fell out of the chair that he was sitting in hitting his head causing laceration.  Patient has a moderate hematoma to the right forehead and a 2 cm laceration intersecting right eyebrow.  Patient denies any severe pain.  Reports immediate bleeding.  Patient was not unconscious for very long.  Patient denies any nausea/vomiting, blurred vision, severe headache, altered mental status.  Patient is otherwise neurologically intact is just concerned about laceration.  No current facility-administered medications for this encounter.  Current Outpatient Medications:    hydrOXYzine  (ATARAX ) 25 MG tablet, Take 25 mg by mouth 3 (three) times daily as needed., Disp: , Rfl:    clonazePAM  (KLONOPIN ) 0.5 MG tablet, TAKE ONE TABLET AT BEDTIME AS NEEDED FOR SLEEP (Patient taking differently: Take 0.5 mg by mouth at bedtime.), Disp: 30 tablet, Rfl: 2   fluvoxaMINE  (LUVOX ) 25 MG tablet, Take 3 tablets (75 mg total) by mouth daily at 8 pm., Disp: 90 tablet, Rfl: 2   hydrOXYzine  (VISTARIL ) 25 MG capsule, Take 1 capsule (25 mg total) by mouth at bedtime as needed and may repeat dose one time if needed for anxiety., Disp: 90 capsule, Rfl: 0   rivaroxaban (XARELTO) 2.5 MG TABS tablet, 1 tablet, Disp: , Rfl:    Allergies  Allergen Reactions   Penicillin G     Other reaction(s): swelling rash   Penicillins Hives, Rash and Swelling    Past Medical History:  Diagnosis Date   Anxiety    Borderline high blood pressure    Borderline hyperlipidemia     Depression    GERD (gastroesophageal reflux disease)    History of hiatal hernia    NASH (nonalcoholic steatohepatitis)    Varicose veins of both legs with edema    history of     Past Surgical History:  Procedure Laterality Date   BREATH TEK H PYLORI N/A 12/19/2012   Procedure: BREATH TEK H PYLORI;  Surgeon: Azucena Bollard, MD;  Location: [facility];  Service: General;  Laterality: N/A;  Pt is scheduled at 730   LAPAROSCOPIC GASTRIC BANDING WITH HIATAL HERNIA REPAIR  06/26/2013   Procedure: LAPAROSCOPIC GASTRIC BANDING WITH HIATAL HERNIA REPAIR;  Surgeon: Azucena Bollard, MD;  Location: [facility];  Service: General;;   TONSILLECTOMY AND ADENOIDECTOMY Bilateral 06/12/2021   Procedure: TONSILLECTOMY;  Surgeon: Ammon Bales, MD;  Location: [facility];  Service: ENT;  Laterality: Bilateral;   UMBILICAL HERNIA REPAIR  06/26/2013   Procedure: HERNIA REPAIR UMBILICAL ADULT;  Surgeon: Azucena Bollard, MD;  Location: [facility];  Service: General;;    Family History  Problem Relation Age of Onset   Cancer Maternal Grandmother        colon   COPD Other    Hypertension Other    Hyperlipidemia Other    Obesity Other     Social History   Tobacco Use   Smoking status: Never   Smokeless tobacco: Former    Types: Chew    Quit date: 04/05/2010  Substance Use Topics   Alcohol use: Not Currently    Comment:  three drinks a day   Drug use: Yes    Types: Marijuana    ROS Refer to HPI for ROS details.  Objective:   Vitals: BP 98/64 (BP Location: Left Arm)   Pulse 83   Temp 98 F (36.7 C) (Oral)   Resp 17   SpO2 96%   Physical Exam Vitals and nursing note reviewed.  Constitutional:      General: He is not in acute distress.    Appearance: Normal appearance. He is well-developed. He is not ill-appearing or toxic-appearing.  HENT:     Head: Normocephalic. Contusion and laceration present.   Eyes:     General:        Right eye: No discharge.        Left eye: No discharge.      Extraocular Movements: Extraocular movements intact.     Conjunctiva/sclera: Conjunctivae normal.  Cardiovascular:     Rate and Rhythm: Normal rate.  Pulmonary:     Effort: Pulmonary effort is normal. No respiratory distress.  Skin:    General: Skin is warm and dry.     Capillary Refill: Capillary refill takes less than 2 seconds.     Findings: Laceration present.  Neurological:     General: No focal deficit present.     Mental Status: He is alert and oriented to person, place, and time.  Psychiatric:        Mood and Affect: Mood normal.        Behavior: Behavior normal.     Laceration Repair  Date/Time: 08/14/2023 3:21 PM  Performed by: Alease Hunter, NP Authorized by: Alease Hunter, NP   Consent:    Consent obtained:  Verbal   Consent given by:  Patient and spouse   Risks discussed:  Poor wound healing, pain and infection Universal protocol:    Patient identity confirmed:  Verbally with patient and arm band Anesthesia:    Anesthesia method:  Local infiltration   Local anesthetic:  Lidocaine  1% WITH epi Laceration details:    Location:  Face   Face location:  R eyebrow   Length (cm):  2   Depth (mm):  5 Pre-procedure details:    Preparation:  Patient was prepped and draped in usual sterile fashion Treatment:    Area cleansed with:  Saline   Amount of cleaning:  Standard Skin repair:    Repair method:  Sutures   Suture size:  4-0   Suture material:  Prolene   Suture technique:  Simple interrupted   Number of sutures:  6 Approximation:    Approximation:  Close Repair type:    Repair type:  Simple Post-procedure details:    Dressing:  Open (no dressing)   Procedure completion:  Tolerated   No results found for this or any previous visit (from the past 24 hours).  No results found.   Assessment and Plan :     Discharge Instructions       1. Facial laceration, initial encounter (Primary) - Laceration Repair performed in UC, 6 sutures  placed in right eyebrow/right upper eyelid, close approximation, no bleeding, moderate bruising noted to right upper eyelid.  No secondary signs of infection. - Patient advised to take ibuprofen 600 to 800 mg every 6-8 hours as needed for pain and inflammation secondary to head injury and laceration. - Patient advised to apply ice to forehead and over the laceration 2-3 times a day for 10 to 15 minutes at a time for inflammation and  pain. - Patient advised to continue monitoring for signs of concussion such as nausea vomiting, headache, blurred vision, dizziness, altered mental status. -Continue to monitor symptoms for any change in severity if there is any escalation of current symptoms or development of new symptoms follow-up in ER for further evaluation and management. - Patient advised to follow-up in 7 to 10 days for removal of sutures here in urgent care.    Leiby Pigeon B Mettie Roylance   Lorian Yaun, [place], [place] 08/14/23 1525

## 2023-08-14 NOTE — Discharge Instructions (Addendum)
  1. Facial laceration, initial encounter (Primary) - Laceration Repair performed in UC, 6 sutures placed in right eyebrow/right upper eyelid, close approximation, no bleeding, moderate bruising noted to right upper eyelid.  No secondary signs of infection. - Patient advised to take ibuprofen 600 to 800 mg every 6-8 hours as needed for pain and inflammation secondary to head injury and laceration. - Patient advised to apply ice to forehead and over the laceration 2-3 times a day for 10 to 15 minutes at a time for inflammation and pain. - Patient advised to continue monitoring for signs of concussion such as nausea vomiting, headache, blurred vision, dizziness, altered mental status. -Continue to monitor symptoms for any change in severity if there is any escalation of current symptoms or development of new symptoms follow-up in ER for further evaluation and management. - Patient advised to follow-up in 7 to 10 days for removal of sutures here in urgent care.

## 2023-08-14 NOTE — ED Triage Notes (Addendum)
 Pt reports that he stood up and next thing he knew he woke up on the floor. Has laceration to right eye brow. Ate crackers and wine today. Pt denies taking any blood thinners at this current time.

## 2023-08-21 ENCOUNTER — Encounter (HOSPITAL_COMMUNITY): Payer: Self-pay | Admitting: Emergency Medicine

## 2023-08-21 ENCOUNTER — Other Ambulatory Visit: Payer: Self-pay

## 2023-08-21 ENCOUNTER — Ambulatory Visit (HOSPITAL_COMMUNITY): Admission: EM | Admit: 2023-08-21 | Discharge: 2023-08-21 | Disposition: A | Discharge status: Home / Self Care

## 2023-08-21 NOTE — ED Triage Notes (Signed)
 Pt here for suture removal. Sutures done 08/14/23

## 2023-08-21 NOTE — ED Triage Notes (Signed)
 Wound well healed, 6 sutures removed and steri-strips applied. Pt oriented to wash with water and soap as usual and keep it clean until complete healed.

## 2023-08-22 ENCOUNTER — Ambulatory Visit (HOSPITAL_COMMUNITY): Payer: BC Managed Care – PPO | Admitting: Licensed Clinical Social Worker

## 2023-09-19 ENCOUNTER — Ambulatory Visit (HOSPITAL_COMMUNITY): Payer: BC Managed Care – PPO | Admitting: Licensed Clinical Social Worker

## 2024-01-18 ENCOUNTER — Ambulatory Visit (INDEPENDENT_AMBULATORY_CARE_PROVIDER_SITE_OTHER): Payer: Self-pay | Admitting: Licensed Clinical Social Worker

## 2024-01-18 DIAGNOSIS — F331 Major depressive disorder, recurrent, moderate: Secondary | ICD-10-CM

## 2024-01-18 DIAGNOSIS — F411 Generalized anxiety disorder: Secondary | ICD-10-CM

## 2024-01-18 NOTE — Progress Notes (Signed)
 Virtual Visit via Video Note   I connected with Phillip [name], who prefers to go by Phillip [name] on 01/18/24 at 2:00pm by video enabled telemedicine application and verified that I am speaking with the correct person using two identifiers.   I discussed the limitations, risks, security and privacy concerns of performing an evaluation and management service by video and the availability of in person appointments. I also discussed with the patient that there may be a patient responsible charge related to this service. The patient expressed understanding and agreed to proceed.   I discussed the assessment and treatment plan with the patient. The patient was provided an opportunity to ask questions and all were answered. The patient agreed with the plan and demonstrated an understanding of the instructions.   The patient was advised to call back or seek an in-person evaluation if the symptoms worsen or if the condition fails to improve as anticipated.   I provided 32 minutes of non-face-to-face time during this encounter.   Phillip Ricker, LCSW, LCAS ________________________________  THERAPIST PROGRESS NOTE   Session Time: 2:00pm  - 2:32pm   Location: Patient: Patient Home Provider: [facility]   Participation Level: Active    Behavioral Response: Alert, casually dressed, anxious mood/affect   Type of Therapy:  Individual Therapy   Treatment Goals addressed: Management of depression and anxiety; Medication compliance; Maintaining employment  Progress Towards Goals: Progressing    Interventions: CBT, problem solving    Summary: Phillip [name], who prefers to go by Phillip [name] is a 48 year old married Caucasian male that presented today with diagnoses of Major Depressive Disorder, recurrent, moderate; and Generalized Anxiety Disorder.          Suicidal/Homicidal: None; without intent or plan.     Therapist Response:  Clinician met with Phillip [name], who prefers to go by Phillip [name] today for  virtual therapy session and assessed for safety, medication compliance, and sobriety.  Phillip [name] presented for appointment on time and was alert, oriented x5, with no evidence or self-report of active SI/HI or A/V H.  Phillip [name] denied any abuse of illicit substances.  Phillip [name] reported that he has reduced overall alcohol consumption, limiting drinking to weekends when he has 4 drinks at most.  He reported compliance with behavioral medication.  Clinician inquired about Phillip [name]'s emotional ratings today, as well as any significant changes in thoughts, feelings or behavior since last check-in.  Phillip [name] reported scores of 4/10 for depression, 4/10 for anxiety, and 4/10 for anger/irritability.  Phillip [name] denied any recent panic attacks or outbursts.  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  Phillip [name] reported that since starting a new job, he hopes to prioritize therapy again, although he will have to be virtual some days, and only had roughly 30 minutes today on break from his job to speak with this provider.  Phillip [name] reported that although he enjoys his new job, he worries about pushing himself too much again, stating Its very stressful, I have long hours, and I know I could push past my limits if I'm not careful.  Clinician covered topic of burnout with Phillip [name] to assist. Clinician explained how burnout is a state of emotional, mental, and physical exhaustion caused by excessive and prolonged work related stress. Clinician explained how the negative effects of burnout manifest in all aspects of life, including home, work and social life. Clinician went through a checklist of different physical,  emotional and behavioral burnout signs that could indicate Phillip [name]'s work life balance is off, and inquired about which ones he could identify with at present. Clinician also discussed importance of maintaining a healthy self-care routine, and inquired about positive activities that Phillip [name] may have been  neglecting, and needs to prioritize more as he transitions to a new workplace environment.  Intervention was effective, as evidenced by Phillip [name]'s active engagement in discussion on subject, and identification of numerous burnout signs suggesting that his prior job was a trigger for worsening depression, anxiety and drinking to cope. Phillip [name] reported that these signs included feeling tired and drained, lowered immunity, irregular sleep habits, restrictive eating, self-doubt, feelings of detachment, isolation from others, lack of motivation, and procrastination. Phillip [name] reported that he will spend more time on self-care activities to ensure a healthier balance, including attending followup medical appointments to address a recent muscle tear, taking time off for vacation when possible, working virtually when possible to cut down on long daily commutes, and spending adequate time with family to ensure strong bond. Phillip [name] stated When I get home, I don't need to think about work all the time. Clinician will continue to monitor.   Plan: Follow up in 2 weeks.   Diagnosis: Major Depressive Disorder, recurrent, moderate; and Generalized Anxiety Disorder.  Collaboration of Care:   No collaboration of care required for this visit.                                                   Patient/Guardian was advised Release of Information must be obtained prior to any record release in order to collaborate their care with an outside provider. Patient/Guardian was advised if they have not already done so to contact the registration department to sign all necessary forms in order for us  to release information regarding their care.    Consent: Patient/Guardian gives verbal consent for treatment and assignment of benefits for services provided during this visit. Patient/Guardian expressed understanding and agreed to proceed.   Phillip Ricker, LCSW, LCAS 01/18/24

## 2024-02-28 ENCOUNTER — Ambulatory Visit (INDEPENDENT_AMBULATORY_CARE_PROVIDER_SITE_OTHER): Payer: Self-pay | Admitting: Licensed Clinical Social Worker

## 2024-02-28 DIAGNOSIS — F331 Major depressive disorder, recurrent, moderate: Secondary | ICD-10-CM

## 2024-02-28 DIAGNOSIS — F411 Generalized anxiety disorder: Secondary | ICD-10-CM | POA: Diagnosis not present
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
## 2024-03-21 ENCOUNTER — Ambulatory Visit (INDEPENDENT_AMBULATORY_CARE_PROVIDER_SITE_OTHER): Payer: Self-pay | Admitting: Licensed Clinical Social Worker

## 2024-03-21 DIAGNOSIS — F411 Generalized anxiety disorder: Secondary | ICD-10-CM | POA: Diagnosis not present

## 2024-03-21 DIAGNOSIS — F331 Major depressive disorder, recurrent, moderate: Secondary | ICD-10-CM | POA: Diagnosis not present

## 2024-03-21 NOTE — Progress Notes (Signed)
 Virtual Visit via Video Note   I connected with Phillip [name], who prefers to go by Phillip [name] on 03/21/24 at 3:00pm by video enabled telemedicine application and verified that I am speaking with the correct person using two identifiers.   I discussed the limitations, risks, security and privacy concerns of performing an evaluation and management service by video and the availability of in person appointments. I also discussed with the patient that there may be a patient responsible charge related to this service. The patient expressed understanding and agreed to proceed.   I discussed the assessment and treatment plan with the patient. The patient was provided an opportunity to ask questions and all were answered. The patient agreed with the plan and demonstrated an understanding of the instructions.   The patient was advised to call back or seek an in-person evaluation if the symptoms worsen or if the condition fails to improve as anticipated.   I provided 1 hour of non-face-to-face time during this encounter.   Darleene Ricker, LCSW, LCAS ________________________________  THERAPIST PROGRESS NOTE   Session Time: 3:00pm  - 4:00pm    Location: Patient: Patient Home Provider: Home Office   Participation Level: Active    Behavioral Response: Alert, casually dressed, anxious mood/affect   Type of Therapy:  Individual Therapy   Treatment Goals addressed: Management of depression and anxiety; Medication compliance; Exploring triggers related to marital stress   Progress Towards Goals: Progressing    Interventions: CBT, communication skills    Summary: Phillip [name], who prefers to go by Phillip [name] is a 48 year old married Caucasian male that presented today with diagnoses of Generalized Anxiety Disorder and Major Depressive Disorder, recurrent, moderate.          Suicidal/Homicidal: None; without intent or plan.     Therapist Response:  Clinician met with Phillip [name], who prefers to go by  Phillip [name] today for virtual therapy appointment and assessed for safety, medication compliance, and sobriety.  Phillip [name] presented for session on time and was alert, oriented x5, with no evidence or self-report of active SI/HI or A/V H.  Phillip [name] denied any abuse of illicit substances.  Phillip [name] reported that he has continued limited alcohol consumption, limiting drinking to weekends when he has 4 drinks at most.  He reported compliance with behavioral medication.  Clinician inquired about Phillip [name]'s current emotional ratings, as well as any significant changes in thoughts, feelings or behavior since previous check-in.  Phillip [name] reported scores of 3/10 for depression, 3/10 for anxiety, and 2/10 for anger/irritability.  Phillip [name] denied any recent panic attacks or outbursts.  Phillip [name] reported that an ongoing struggle has been managing his workload at the job, stating This is the last week of the worst time of my year. It will slow down a bit after this, so I'm looking now to schedule time off.  He reported that a struggle is ongoing arguments with his wife, which triggers him, stating When she is angry, it gets me angry, and we feed off each others' energy.  Clinician covered topic of conflict resolution today and virtually shared a handout on subject with Phillip [name] which warned against the 'four horsemen' of communication traps that should be avoided due to tendency to escalate and damage a relationship.  These included criticism, defensiveness, contempt, and stonewalling.  'Antidotes' to these harmful behaviors were offered as healthy replacements to improve communication and understanding, including approaching problems with a gentle startup approach, taking responsibility for one's behavior, sharing fondness/admiration, and using self-soothing to  calm down and focus on the problem at hand.  Clinician encouraged Phillip [name] to apply this material to recent experiences with conflict that he has faced in the marriage, consider which approach he utilized, and any changes that  he could plan to implement in order to improve overall conflict resolution skills.  Intervention was effective, as evidenced by Phillip [name] actively participating in discussion on topic, reporting that he has engaged in some of these communication traps before, including contempt and stonewalling.  Phillip [name] reported that his wife can be critical about many things, and has a 'perfectionist' attitude, so this will set him off to the point where he can say hurtful things, and feel guilty afterward.  Phillip [name] reported that most recently he has begun to shut down, and avoid talking, which only makes her more upset.  Phillip [name] expressed receptiveness to alternative strategies offered in order to improve conflict resolution skills, and reported that he would also recommend that his wife consider individual or marriage therapy due to the severity of this problem.  Phillip [name] stated I've learned that I have to be careful what I say.  Clinician will continue to monitor.   Plan: Follow up in 2 weeks.   Diagnosis: Generalized Anxiety Disorder and Major Depressive Disorder, recurrent, moderate.   Collaboration of Care:   No collaboration of care required for this visit.                                                   Patient/Guardian was advised Release of Information must be obtained prior to any record release in order to collaborate their care with an outside provider. Patient/Guardian was advised if they have not already done so to contact the registration department to sign all necessary forms in order for us  to release information regarding their care.    Consent: Patient/Guardian gives verbal consent for treatment and assignment of benefits for services provided during this visit. Patient/Guardian expressed understanding and agreed to proceed.   Darleene Ricker, [place], LCAS 03/21/24

## 2024-04-17 ENCOUNTER — Encounter: Payer: Self-pay | Admitting: Cardiovascular Disease

## 2024-04-17 ENCOUNTER — Ambulatory Visit: Attending: Cardiovascular Disease | Admitting: Cardiovascular Disease

## 2024-04-17 VITALS — BP 118/88 | HR 63 | Ht 72.0 in | Wt 305.2 lb

## 2024-04-17 DIAGNOSIS — R002 Palpitations: Secondary | ICD-10-CM

## 2024-04-17 DIAGNOSIS — R55 Syncope and collapse: Secondary | ICD-10-CM

## 2024-04-17 NOTE — Progress Notes (Signed)
 "   Chief Complaint  Patient presents with   New Patient (Initial Visit)    Syncope     History of Present Illness:48 yo male with history of anxiety, depression, gout, DVT, GERD and hiatal hernia who is here today as a new patient for the evaluation of syncope. He tells me today that he had an event in May 2025 where he became lightheaded and lost consciousness when moving from a sitting to a standing position. He hit his head and sustained a laceration over his right eyebrow. He recently wore a cardiac monitor that was arranged in primary care and was told this was abnormal. He also had an echocardiogram and was told it was normal. I do not have these records.   He continues to have episodes of mild dizziness and palpitations several times per month. He has no chest pain or dyspnea.   Primary Care Physician: Verdia Lombard, MD   Past Medical History:  Diagnosis Date   Anxiety    Borderline high blood pressure    Borderline hyperlipidemia    Depression    GERD (gastroesophageal reflux disease)    History of hiatal hernia    NASH (nonalcoholic steatohepatitis)    Varicose veins of both legs with edema    history of    Past Surgical History:  Procedure Laterality Date   BREATH TEK H PYLORI N/A 12/19/2012   Procedure: BREATH TEK H PYLORI;  Surgeon: Donnice KATHEE Lunger, MD;  Location: [facility];  Service: General;  Laterality: N/A;  Pt is scheduled at 730   LAPAROSCOPIC GASTRIC BANDING WITH HIATAL HERNIA REPAIR  06/26/2013   Procedure: LAPAROSCOPIC GASTRIC BANDING WITH HIATAL HERNIA REPAIR;  Surgeon: Donnice KATHEE Lunger, MD;  Location: [facility];  Service: General;;   TONSILLECTOMY AND ADENOIDECTOMY Bilateral 06/12/2021   Procedure: TONSILLECTOMY;  Surgeon: Mable Lenis, MD;  Location: [facility];  Service: ENT;  Laterality: Bilateral;   UMBILICAL HERNIA REPAIR  06/26/2013   Procedure: HERNIA REPAIR UMBILICAL ADULT;  Surgeon: Donnice KATHEE Lunger, MD;  Location: [facility];  Service: General;;     Current Outpatient Medications  Medication Sig Dispense Refill   allopurinol (ZYLOPRIM) 100 MG tablet Take 100 mg by mouth daily.     clonazePAM  (KLONOPIN ) 0.5 MG tablet TAKE ONE TABLET AT BEDTIME AS NEEDED FOR SLEEP (Patient taking differently: Take 0.5 mg by mouth at bedtime.) 30 tablet 2   colchicine 0.6 MG tablet Take 0.6 mg by mouth daily.     fluvoxaMINE  (LUVOX ) 25 MG tablet Take 3 tablets (75 mg total) by mouth daily at 8 pm. 90 tablet 2   hydrOXYzine  (ATARAX ) 25 MG tablet Take 25 mg by mouth 3 (three) times daily as needed.     hydrOXYzine  (VISTARIL ) 25 MG capsule Take 1 capsule (25 mg total) by mouth at bedtime as needed and may repeat dose one time if needed for anxiety. 90 capsule 0   rivaroxaban (XARELTO) 2.5 MG TABS tablet 1 tablet     No current facility-administered medications for this visit.    Allergies[1]  Social History   Socioeconomic History   Marital status: Married    Spouse name: Not on file   Number of children: 2   Years of education: Not on file   Highest education level: Not on file  Occupational History   Occupation: Solicitor  Tobacco Use   Smoking status: Never   Smokeless tobacco: Former    Types: Chew    Quit date: 04/05/2010  Substance  and Sexual Activity   Alcohol use: Yes    Comment: Several drinks per week   Drug use: Yes    Types: Marijuana   Sexual activity: Yes    Birth control/protection: Sponge, None  Other Topics Concern   Not on file  Social History Narrative   Not on file   Social Drivers of Health   Tobacco Use: Medium Risk (04/17/2024)   Patient History    Smoking Tobacco Use: Never    Smokeless Tobacco Use: Former    Passive Exposure: Not on Actuary Strain: Not on file  Food Insecurity: Not on file  Transportation Needs: Not on file  Physical Activity: Not on file  Stress: Not on file  Social Connections: Unknown (08/18/2021)   Received from [facility]   Social Network    Social  Network: Not on file  Intimate Partner Violence: Unknown (07/10/2021)   Received from [facility]   HITS    Physically Hurt: Not on file    Insult or Talk Down To: Not on file    Threaten Physical Harm: Not on file    Scream or Curse: Not on file  Depression (PHQ2-9): Low Risk (02/28/2024)   Depression (PHQ2-9)    PHQ-2 Score: 1  Alcohol Screen: Not on file  Housing: Not on file  Utilities: Not on file  Health Literacy: Not on file    Family History  Problem Relation Age of Onset   Thyroid  disease Mother    Hypertension Father    Cancer Maternal Grandmother        colon   COPD Other    Hypertension Other    Hyperlipidemia Other    Obesity Other     Review of Systems:  As stated in the HPI and otherwise negative.   BP 118/88   Pulse 63   Ht 6' (1.829 m)   Wt (!) 305 lb 3.2 oz (138.4 kg)   SpO2 96%   BMI 41.39 kg/m   Physical Examination: General: Well developed, well nourished, NAD  HEENT: OP clear, mucus membranes moist  SKIN: warm, dry. No rashes. Neuro: No focal deficits  Musculoskeletal: Muscle strength 5/5 all ext  Psychiatric: Mood and affect normal  Neck: No JVD, no carotid bruits, no thyromegaly, no lymphadenopathy.  Lungs:Clear bilaterally, no wheezes, rhonci, crackles Cardiovascular: Regular rate and rhythm. No murmurs, gallops or rubs. Abdomen:Soft. Bowel sounds present. Non-tender.  Extremities: No lower extremity edema. Pulses are 2 + in the bilateral DP/PT.  EKG:  EKG is ordered today. The ekg ordered today demonstrates  EKG Interpretation Date/Time:  Tuesday April 17 2024 08:26:17 EST Ventricular Rate:  71 PR Interval:  158 QRS Duration:  90 QT Interval:  382 QTC Calculation: 415 R Axis:   -10  Text Interpretation: Normal sinus rhythm Inferior Q waves noted Confirmed by Verlin Bruckner [phone]) on 04/17/2024 8:29:51 AM    Recent Labs: No results found for requested labs within last 365 days.   Lipid Panel    Component Value  Date/Time   CHOL 138 01/27/2007 0913   TRIG 64 01/27/2007 0913   HDL 44.1 01/27/2007 0913   CHOLHDL 3.1 CALC 01/27/2007 0913   VLDL 13 01/27/2007 0913   LDLCALC 81 01/27/2007 0913     Wt Readings from Last 3 Encounters:  04/17/24 (!) 305 lb 3.2 oz (138.4 kg)  12/31/21 275 lb 11.2 oz (125.1 kg)  11/30/21 275 lb 12.8 oz (125.1 kg)    Assessment and Plan:  1. Syncope/Palpitations: He continues to have occasional dizziness and palpitations. We will obtain the records from primary care including the cardiac monitor and echo. Based on this, I will make recommendations.   Labs/ tests ordered today include:   Orders Placed This Encounter  Procedures   EKG 12-Lead   Disposition:   F/U with me in 3 months  Signed, Lonni Cash, MD, [facility] 04/17/2024 9:03 AM    [facility] [address] [place], [place], [place]  [address] Phone: [phone]; Fax: [phone]       [1]  Allergies Allergen Reactions   Penicillin G     Other reaction(s): swelling rash   Penicillins Hives, Rash and Swelling   "

## 2024-04-17 NOTE — Patient Instructions (Signed)
 Medication Instructions:  Your physician recommends that you continue on your current medications as directed. Please refer to the Current Medication list given to you today.  *If you need a refill on your cardiac medications before your next appointment, please call your pharmacy*  Lab Work: none If you have labs (blood work) drawn today and your tests are completely normal, you will receive your results only by: MyChart Message (if you have MyChart) OR A paper copy in the mail If you have any lab test that is abnormal or we need to change your treatment, we will call you to review the results.  Testing/Procedures: none  Follow-Up: At [facility], you and your health needs are our priority.  As part of our continuing mission to provide you with exceptional heart care, our providers are all part of one team.  This team includes your primary Cardiologist (physician) and Advanced Practice Providers or APPs (Physician Assistants and Nurse Practitioners) who all work together to provide you with the care you need, when you need it.  Your next appointment:   To be determined  Provider:   Lonni Cash, MD    We recommend signing up for the patient portal called MyChart.  Sign up information is provided on this After Visit Summary.  MyChart is used to connect with patients for Virtual Visits (Telemedicine).  Patients are able to view lab/test results, encounter notes, upcoming appointments, etc.  Non-urgent messages can be sent to your provider as well.   To learn more about what you can do with MyChart, go to [email or web address].   Other Instructions
